# Patient Record
Sex: Male | Born: 1955 | Race: White | Hispanic: No | Marital: Married | Smoking: Never smoker
Health system: Southern US, Community
[De-identification: ages and names within clinical notes are randomized; demographics above are authoritative.]

## PROBLEM LIST (undated history)

## (undated) DIAGNOSIS — G4700 Insomnia, unspecified: Secondary | ICD-10-CM

## (undated) DIAGNOSIS — R51 Headache: Secondary | ICD-10-CM

## (undated) DIAGNOSIS — R519 Headache, unspecified: Secondary | ICD-10-CM

## (undated) DIAGNOSIS — G4730 Sleep apnea, unspecified: Secondary | ICD-10-CM

## (undated) DIAGNOSIS — E291 Testicular hypofunction: Secondary | ICD-10-CM

## (undated) HISTORY — DX: Headache: R51

## (undated) HISTORY — DX: Headache, unspecified: R51.9

## (undated) HISTORY — DX: Testicular hypofunction: E29.1

## (undated) HISTORY — DX: Insomnia, unspecified: G47.00

---

## 1991-06-11 HISTORY — PX: NASAL SINUS SURGERY: SHX719

## 1997-06-10 HISTORY — PX: ELBOW SURGERY: SHX618

## 2015-05-16 ENCOUNTER — Ambulatory Visit (INDEPENDENT_AMBULATORY_CARE_PROVIDER_SITE_OTHER): Payer: 59 | Admitting: Endocrinology

## 2015-05-16 ENCOUNTER — Encounter: Payer: Self-pay | Admitting: Endocrinology

## 2015-05-16 VITALS — BP 146/88 | HR 88 | Temp 98.7°F | Ht 71.5 in | Wt 254.0 lb

## 2015-05-16 DIAGNOSIS — E291 Testicular hypofunction: Secondary | ICD-10-CM | POA: Diagnosis not present

## 2015-05-16 LAB — T4, FREE: Free T4: 0.77 ng/dL (ref 0.60–1.60)

## 2015-05-16 LAB — TSH: TSH: 1.42 u[IU]/mL (ref 0.35–4.50)

## 2015-05-16 NOTE — Patient Instructions (Addendum)
blood tests are requested for you today.  We'll let you know about the results. If you wish to have other blood tests to see why the testosterone is low, we would need to do those after being off the testosterone for a few months.  Please let me know.   I would be happy to see you back here as necessary.

## 2015-05-16 NOTE — Progress Notes (Signed)
Subjective:    Patient ID: Kristopher Stout, male    DOB: [birth year], 59 y.o.   MRN: [id number]  HPI Pt reports he had puberty at age 78.  He has 2 biological children.  He says he has never taken illicit androgens.  He does not take antiandrogens or opioids.  He denies any h/o infertility, XRT, or genital infection.  He has never had surgery, or a serious injury to the head or genital area.  He consumes alcohol only 1/week. He was dx'ed with low testosterone in 2015. He presented with moderate loss of hair on the legs, and assoc fatigue.  He was rx'ed androgel.  He is unaware of any w/u of hypogonadism.  He takes androgel qd as rx'ed, but sxs have not improved.  Therefore, he plans to d/c the androgel next week, when he runs out.  No past medical history on file.  No past surgical history on file.  Social History   Social History  . Marital Status: Married    Spouse Name: N/A  . Number of Children: N/A  . Years of Education: N/A   Occupational History  . Not on file.   Social History Main Topics  . Smoking status: Unknown If Ever Smoked  . Smokeless tobacco: Not on file  . Alcohol Use: Not on file  . Drug Use: Not on file  . Sexual Activity: Not on file   Other Topics Concern  . Not on file   Social History Narrative  . No narrative on file    No current outpatient prescriptions on file prior to visit.   No current facility-administered medications on file prior to visit.    No Known Allergies  Family History  Problem Relation Age of Onset  . Other Neg Hx     hypogonadism    BP 146/88 mmHg  Pulse 88  Temp(Src) 98.7 F (37.1 C) (Oral)  Ht 5' 11.5" (1.816 m)  Wt 254 lb (115.214 kg)  BMI 34.94 kg/m2  SpO2 98%  Review of Systems denies depression, numbness, erectile dysfunction, decreased urinary stream, gynecomastia, muscle weakness, fever, headache, easy bruising, sob, rash, blurry vision, rhinorrhea, and chest pain.  He has weight gain and decreased libido.       Objective:   Physical Exam VS: see vs page GEN: no distress HEAD: head: no deformity eyes: no periorbital swelling, no proptosis external nose and ears are normal mouth: no lesion seen NECK: supple, thyroid is not enlarged CHEST WALL: no deformity LUNGS: clear to auscultation BREASTS:  No gynecomastia CV: reg rate and rhythm, no murmur ABD: abdomen is soft, nontender.  no hepatosplenomegaly.  not distended.  no hernia GENITALIA:  Normal male, except testicles are small and soft.   MUSCULOSKELETAL: muscle bulk and strength are grossly normal.  no obvious joint swelling.  gait is normal and steady EXTEMITIES: no deformity.  no ulcer on the feet.  feet are of normal color and temp.  no edema.   PULSES: dorsalis pedis intact bilat.  no carotid bruit NEURO:  cn 2-12 grossly intact.   readily moves all 4's.  sensation is intact to touch on the feet SKIN:  Normal texture and temperature.  No rash or suspicious lesion is visible.  Diffusely decreased hair distribution NODES:  None palpable at the neck PSYCH: alert, well-oriented.  Does not appear anxious nor depressed.  I have reviewed outside records, and summarized: Pt was noted to have low testosterone, and referred here.  outside test results  are reviewed: Total testosterone=251      Assessment & Plan:  Hypogonadism, new to me.  uncertain etiology.    Patient is advised the following: Patient Instructions  blood tests are requested for you today.  We'll let you know about the results. If you wish to have other blood tests to see why the testosterone is low, we would need to do those after being off the testosterone for a few months.  Please let me know.   I would be happy to see you back here as necessary.

## 2015-05-17 LAB — PROLACTIN: PROLACTIN: 6.9 ng/mL (ref 2.1–17.1)

## 2015-05-19 LAB — TESTOSTERONE,FREE AND TOTAL
TESTOSTERONE FREE: 6.3 pg/mL — AB (ref 7.2–24.0)
TESTOSTERONE: 236 ng/dL — AB (ref 348–1197)

## 2015-10-04 ENCOUNTER — Other Ambulatory Visit (INDEPENDENT_AMBULATORY_CARE_PROVIDER_SITE_OTHER): Payer: 59

## 2015-10-04 ENCOUNTER — Telehealth: Payer: Self-pay

## 2015-10-04 DIAGNOSIS — E291 Testicular hypofunction: Secondary | ICD-10-CM

## 2015-10-04 LAB — LUTEINIZING HORMONE: LH: 7.33 m[IU]/mL (ref 1.50–9.30)

## 2015-10-04 NOTE — Telephone Encounter (Signed)
Ok, i ordered 

## 2015-10-04 NOTE — Telephone Encounter (Signed)
Ok, but before ordering, i need to know when you last took the androgel.

## 2015-10-04 NOTE — Telephone Encounter (Signed)
Pt is scheduled to come in for labs today at 330 pm. Could you review and please advise what orders should be placed? Thanks!

## 2015-10-04 NOTE — Telephone Encounter (Signed)
I contacted the pt and he stated the last time he took the androgel was November.

## 2015-10-06 LAB — TESTOSTERONE,FREE AND TOTAL
TESTOSTERONE: 150 ng/dL — AB (ref 348–1197)
Testosterone, Free: 6.1 pg/mL — ABNORMAL LOW (ref 7.2–24.0)

## 2015-10-07 ENCOUNTER — Other Ambulatory Visit: Payer: Self-pay | Admitting: Endocrinology

## 2015-10-07 DIAGNOSIS — E291 Testicular hypofunction: Secondary | ICD-10-CM

## 2015-10-07 MED ORDER — CLOMIPHENE CITRATE 50 MG PO TABS: ORAL_TABLET | ORAL | Status: DC

## 2015-12-07 ENCOUNTER — Other Ambulatory Visit: Payer: 59

## 2015-12-13 ENCOUNTER — Other Ambulatory Visit: Payer: 59

## 2015-12-13 DIAGNOSIS — E291 Testicular hypofunction: Secondary | ICD-10-CM

## 2015-12-14 ENCOUNTER — Other Ambulatory Visit: Payer: Self-pay | Admitting: Endocrinology

## 2015-12-14 LAB — TESTOSTERONE,FREE AND TOTAL
Testosterone, Free: 24.2 pg/mL — ABNORMAL HIGH (ref 7.2–24.0)
Testosterone: 706 ng/dL (ref 348–1197)

## 2015-12-14 MED ORDER — CLOMIPHENE CITRATE 50 MG PO TABS: ORAL_TABLET | ORAL | Status: DC

## 2016-04-20 ENCOUNTER — Other Ambulatory Visit: Payer: Self-pay | Admitting: Endocrinology

## 2016-04-20 NOTE — Telephone Encounter (Signed)
Please refill x 1 Ov is due  

## 2016-09-09 ENCOUNTER — Telehealth: Payer: Self-pay | Admitting: Endocrinology

## 2016-09-09 MED ORDER — CLOMIPHENE CITRATE 50 MG PO TABS: ORAL_TABLET | ORAL | 0 refills | Status: DC

## 2016-09-09 NOTE — Telephone Encounter (Signed)
Dr. Everardo All when you can please advise on this message. I was not sure how to proceed. Last office visit was on 05/16/2015. Thanks!

## 2016-09-09 NOTE — Telephone Encounter (Signed)
Pt called in and said that he was about to run out of his Clomid, but he did not know if it was time for his monthly labs yet or not, please advise.

## 2016-09-09 NOTE — Telephone Encounter (Signed)
Rx submitted and patient scheduled for 09/27/2016 at 145 pm.

## 2016-09-09 NOTE — Telephone Encounter (Signed)
Please refill x 1 Ov is due  

## 2016-09-27 ENCOUNTER — Ambulatory Visit (INDEPENDENT_AMBULATORY_CARE_PROVIDER_SITE_OTHER): Payer: 59 | Admitting: Endocrinology

## 2016-09-27 VITALS — BP 144/78 | HR 99 | Temp 98.8°F | Ht 72.0 in | Wt 254.4 lb

## 2016-09-27 DIAGNOSIS — G4700 Insomnia, unspecified: Secondary | ICD-10-CM | POA: Diagnosis not present

## 2016-09-27 DIAGNOSIS — Z125 Encounter for screening for malignant neoplasm of prostate: Secondary | ICD-10-CM | POA: Diagnosis not present

## 2016-09-27 DIAGNOSIS — R519 Headache, unspecified: Secondary | ICD-10-CM

## 2016-09-27 DIAGNOSIS — E291 Testicular hypofunction: Secondary | ICD-10-CM

## 2016-09-27 DIAGNOSIS — R51 Headache: Secondary | ICD-10-CM | POA: Diagnosis not present

## 2016-09-27 LAB — CBC WITH DIFFERENTIAL/PLATELET
Basophils Absolute: 0 10*3/uL (ref 0.0–0.1)
Basophils Relative: 0.7 % (ref 0.0–3.0)
EOS PCT: 4.5 % (ref 0.0–5.0)
Eosinophils Absolute: 0.3 10*3/uL (ref 0.0–0.7)
HCT: 45 % (ref 39.0–52.0)
HEMOGLOBIN: 15.1 g/dL (ref 13.0–17.0)
LYMPHS ABS: 1.8 10*3/uL (ref 0.7–4.0)
Lymphocytes Relative: 30.4 % (ref 12.0–46.0)
MCHC: 33.6 g/dL (ref 30.0–36.0)
MCV: 96 fl (ref 78.0–100.0)
MONO ABS: 0.6 10*3/uL (ref 0.1–1.0)
Monocytes Relative: 9.8 % (ref 3.0–12.0)
NEUTROS PCT: 54.6 % (ref 43.0–77.0)
Neutro Abs: 3.2 10*3/uL (ref 1.4–7.7)
PLATELETS: 218 10*3/uL (ref 150.0–400.0)
RBC: 4.69 Mil/uL (ref 4.22–5.81)
RDW: 13.3 % (ref 11.5–15.5)
WBC: 5.9 10*3/uL (ref 4.0–10.5)

## 2016-09-27 LAB — TSH: TSH: 0.81 u[IU]/mL (ref 0.35–4.50)

## 2016-09-27 LAB — PSA: PSA: 2.37 ng/mL (ref 0.10–4.00)

## 2016-09-27 NOTE — Patient Instructions (Signed)
blood tests are requested for you today.  We'll let you know about the results. Testosterone treatment has risks, including increased or decreased fertility (depending on the type of treatment), hair loss, prostate cancer, benign prostate enlargement, blood clots, liver problems, lower hdl ("good cholesterol"), polycythemia (opposite of anemia), sleep apnea, and behavior changes.   Weight loss helps the testosterone, also.   Please return in 1 year.   

## 2016-09-27 NOTE — Progress Notes (Signed)
   Subjective:    Patient ID: Kristopher Stout, male    DOB: [birth year], 61 y.o.   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  He has fatigue.  ED has recurred.  Past Medical History:  Diagnosis Date  . Hypogonadism male     No past surgical history on file.  Social History   Social History  . Marital status: Married    Spouse name: N/A  . Number of children: N/A  . Years of education: N/A   Occupational History  . Not on file.   Social History Main Topics  . Smoking status: Unknown If Ever Smoked  . Smokeless tobacco: Not on file  . Alcohol use Not on file  . Drug use: Unknown  . Sexual activity: Not on file   Other Topics Concern  . Not on file   Social History Narrative  . No narrative on file    Current Outpatient Prescriptions on File Prior to Visit  Medication Sig Dispense Refill  . clomiPHENE (CLOMID) 50 MG tablet TAKE 1/4 TAB BY MOUTH, 3 TIMES PER WEEK 10 tablet 0  . diclofenac (VOLTAREN) 50 MG EC tablet Take 50 mg by mouth 2 (two) times daily.    . fluticasone (FLONASE) 50 MCG/ACT nasal spray Place into both nostrils daily.    . meclizine (ANTIVERT) 12.5 MG tablet Take 12.5 mg by mouth 3 (three) times daily as needed for dizziness.    . SUMAtriptan (IMITREX) 20 MG/ACT nasal spray Place 20 mg into the nose every 2 (two) hours as needed for migraine or headache. May repeat in 2 hours if headache persists or recurs.    Marland Kitchen zolpidem (AMBIEN CR) 12.5 MG CR tablet Take 12.5 mg by mouth at bedtime as needed for sleep.     No current facility-administered medications on file prior to visit.     No Known Allergies  Family History  Problem Relation Age of Onset  . Other Neg Hx     hypogonadism    BP (!) 144/78 (BP Location: Left Arm, Patient Position: Sitting, Cuff Size: Large)    Pulse 99   Temp 98.8 F (37.1 C) (Oral)   Ht 6' (1.829 m)   Wt 254 lb 6 oz (115.4 kg)   SpO2 96%   BMI 34.50 kg/m   Review of Systems Denies decreased urinary stream    Objective:   Physical Exam VITAL SIGNS:  See vs page.  GENERAL: no distress.  Ext: no edema.       Assessment & Plan:  Hypogonadism: due for recheck.   Obesity: this exacerbates hypogonadism.    Patient Instructions  blood tests are requested for you today.  We'll let you know about the results. Testosterone treatment has risks, including increased or decreased fertility (depending on the type of treatment), hair loss, prostate cancer, benign prostate enlargement, blood clots, liver problems, lower hdl ("good cholesterol"), polycythemia (opposite of anemia), sleep apnea, and behavior changes.  Weight loss helps the testosterone, also.   Please return in 1 year.

## 2016-09-29 ENCOUNTER — Encounter: Payer: Self-pay | Admitting: Endocrinology

## 2016-09-29 DIAGNOSIS — R51 Headache: Secondary | ICD-10-CM

## 2016-09-29 DIAGNOSIS — E291 Testicular hypofunction: Secondary | ICD-10-CM | POA: Insufficient documentation

## 2016-09-29 DIAGNOSIS — R519 Headache, unspecified: Secondary | ICD-10-CM | POA: Insufficient documentation

## 2016-09-29 DIAGNOSIS — G4700 Insomnia, unspecified: Secondary | ICD-10-CM | POA: Insufficient documentation

## 2016-09-29 LAB — TESTOSTERONE,FREE AND TOTAL
TESTOSTERONE: 399 ng/dL (ref 264–916)
Testosterone, Free: 8.9 pg/mL (ref 7.2–24.0)

## 2016-09-30 ENCOUNTER — Other Ambulatory Visit: Payer: Self-pay

## 2016-09-30 MED ORDER — CLOMIPHENE CITRATE 50 MG PO TABS: ORAL_TABLET | ORAL | 4 refills | Status: DC

## 2017-07-21 HISTORY — PX: CHOLECYSTECTOMY: SHX55

## 2017-09-24 ENCOUNTER — Ambulatory Visit (INDEPENDENT_AMBULATORY_CARE_PROVIDER_SITE_OTHER): Payer: 59 | Admitting: Endocrinology

## 2017-09-24 ENCOUNTER — Encounter: Payer: Self-pay | Admitting: Endocrinology

## 2017-09-24 VITALS — BP 158/82 | HR 81 | Wt 257.2 lb

## 2017-09-24 DIAGNOSIS — E291 Testicular hypofunction: Secondary | ICD-10-CM

## 2017-09-24 DIAGNOSIS — Z125 Encounter for screening for malignant neoplasm of prostate: Secondary | ICD-10-CM | POA: Diagnosis not present

## 2017-09-24 NOTE — Progress Notes (Signed)
Subjective:    Patient ID: Kristopher Stout, male    DOB: [birth year], 62 y.o.   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  Main symptom is again fatigue.  He has some ED sxs.   Past Medical History:  Diagnosis Date  . Headache   . Hypogonadism male   . Insomnia     History reviewed. No pertinent surgical history.  Social History   Socioeconomic History  . Marital status: Married    Spouse name: Not on file  . Number of children: Not on file  . Years of education: Not on file  . Highest education level: Not on file  Occupational History  . Not on file  Social Needs  . Financial resource strain: Not on file  . Food insecurity:    Worry: Not on file    Inability: Not on file  . Transportation needs:    Medical: Not on file    Non-medical: Not on file  Tobacco Use  . Smoking status: Unknown If Ever Smoked  Substance and Sexual Activity  . Alcohol use: Not on file  . Drug use: Not on file  . Sexual activity: Not on file  Lifestyle  . Physical activity:    Days per week: Not on file    Minutes per session: Not on file  . Stress: Not on file  Relationships  . Social connections:    Talks on phone: Not on file    Gets together: Not on file    Attends religious service: Not on file    Active member of club or organization: Not on file    Attends meetings of clubs or organizations: Not on file    Relationship status: Not on file  . Intimate partner violence:    Fear of current or ex partner: Not on file    Emotionally abused: Not on file    Physically abused: Not on file    Forced sexual activity: Not on file  Other Topics Concern  . Not on file  Social History Narrative  . Not on file    Current Outpatient Medications on File Prior to Visit  Medication Sig Dispense Refill    . clomiPHENE (CLOMID) 50 MG tablet TAKE 1/4 TAB BY MOUTH, 3 TIMES PER WEEK 15 tablet 4  . diclofenac (VOLTAREN) 50 MG EC tablet Take 50 mg by mouth 2 (two) times daily.    . fluticasone (FLONASE) 50 MCG/ACT nasal spray Place into both nostrils daily.    . meclizine (ANTIVERT) 12.5 MG tablet Take 12.5 mg by mouth 3 (three) times daily as needed for dizziness.    . SUMAtriptan (IMITREX) 20 MG/ACT nasal spray Place 20 mg into the nose every 2 (two) hours as needed for migraine or headache. May repeat in 2 hours if headache persists or recurs.    Marland Kitchen zolpidem (AMBIEN CR) 12.5 MG CR tablet Take 12.5 mg by mouth at bedtime as needed for sleep.     No current facility-administered medications on file prior to visit.     No Known Allergies  Family History  Problem Relation Age of Onset  . Other Neg Hx        hypogonadism    BP (!) 158/82 (BP Location: Left Arm, Patient Position: Sitting, Cuff Size: Normal)   Pulse  81   Wt 257 lb 3.2 oz (116.7 kg)   SpO2 95%   BMI 34.88 kg/m    Review of Systems Denies insomnia.      Objective:   Physical Exam VITAL SIGNS:  See vs page.  GENERAL: no distress.  Ext: no edema.    Lab Results  Component Value Date   TESTOSTERONE 365 09/24/2017      Assessment & Plan:  HTN: is noted today Hypogonadism: well-controlled Fatigue: check TSH  Patient Instructions  Your blood pressure is high today.  Please see your primary care provider soon, to have it rechecked blood tests are requested for you today.  We'll let you know about the results. Testosterone treatment has risks, including increased or decreased fertility (depending on the type of treatment), hair loss, prostate cancer, benign prostate enlargement, blood clots, liver problems, lower hdl ("good cholesterol"), polycythemia (opposite of anemia), sleep apnea, and behavior changes. Please come back for a follow-up appointment in 1 year.

## 2017-09-24 NOTE — Patient Instructions (Signed)
Your blood pressure is high today.  Please see your primary care provider soon, to have it rechecked blood tests are requested for you today.  We'll let you know about the results. Testosterone treatment has risks, including increased or decreased fertility (depending on the type of treatment), hair loss, prostate cancer, benign prostate enlargement, blood clots, liver problems, lower hdl ("good cholesterol"), polycythemia (opposite of anemia), sleep apnea, and behavior changes. Please come back for a follow-up appointment in 1 year.

## 2017-09-25 LAB — CBC WITH DIFFERENTIAL/PLATELET
BASOS PCT: 0.9 % (ref 0.0–3.0)
Basophils Absolute: 0.1 10*3/uL (ref 0.0–0.1)
EOS PCT: 6.8 % — AB (ref 0.0–5.0)
Eosinophils Absolute: 0.4 10*3/uL (ref 0.0–0.7)
HEMATOCRIT: 41.1 % (ref 39.0–52.0)
HEMOGLOBIN: 14.5 g/dL (ref 13.0–17.0)
Lymphocytes Relative: 38 % (ref 12.0–46.0)
Lymphs Abs: 2.2 10*3/uL (ref 0.7–4.0)
MCHC: 35.2 g/dL (ref 30.0–36.0)
MCV: 94.6 fl (ref 78.0–100.0)
Monocytes Absolute: 0.7 10*3/uL (ref 0.1–1.0)
Monocytes Relative: 12.8 % — ABNORMAL HIGH (ref 3.0–12.0)
Neutro Abs: 2.4 10*3/uL (ref 1.4–7.7)
Neutrophils Relative %: 41.5 % — ABNORMAL LOW (ref 43.0–77.0)
Platelets: 205 10*3/uL (ref 150.0–400.0)
RBC: 4.35 Mil/uL (ref 4.22–5.81)
RDW: 12.8 % (ref 11.5–15.5)
WBC: 5.8 10*3/uL (ref 4.0–10.5)

## 2017-09-25 LAB — PSA: PSA: 2.22 ng/mL (ref 0.10–4.00)

## 2017-09-25 LAB — TESTOSTERONE,FREE AND TOTAL
Testosterone, Free: 7 pg/mL (ref 6.6–18.1)
Testosterone: 365 ng/dL (ref 264–916)

## 2017-09-25 LAB — TSH: TSH: 1.27 u[IU]/mL (ref 0.35–4.50)

## 2017-09-26 ENCOUNTER — Ambulatory Visit: Payer: 59 | Admitting: Endocrinology

## 2017-10-21 ENCOUNTER — Other Ambulatory Visit: Payer: Self-pay | Admitting: Endocrinology

## 2018-03-23 ENCOUNTER — Ambulatory Visit (INDEPENDENT_AMBULATORY_CARE_PROVIDER_SITE_OTHER): Payer: 59 | Admitting: Orthopedic Surgery

## 2018-03-23 ENCOUNTER — Encounter (INDEPENDENT_AMBULATORY_CARE_PROVIDER_SITE_OTHER): Payer: Self-pay | Admitting: Orthopedic Surgery

## 2018-03-23 DIAGNOSIS — S46011A Strain of muscle(s) and tendon(s) of the rotator cuff of right shoulder, initial encounter: Secondary | ICD-10-CM | POA: Diagnosis not present

## 2018-03-25 ENCOUNTER — Encounter (INDEPENDENT_AMBULATORY_CARE_PROVIDER_SITE_OTHER): Payer: Self-pay | Admitting: Orthopedic Surgery

## 2018-03-25 NOTE — Progress Notes (Signed)
Office Visit Note   Patient: Kristopher Stout           Date of Birth: 26-Mar-1956           MRN: [id number] Visit Date: 03/23/2018 Requested by: Wilburn Mylar, MD [address] [place], [place] [address] PCP: Wilburn Mylar, MD  Subjective: Chief Complaint  Patient presents with  . 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  Had acute onset of pain 11/05/2017 when he woke up with the pain.  Was not necessarily a trauma but it was rather abrupt onset.  He reports consistent pain in that right shoulder worse at night when trying to lay down.  He also reports weakness particularly with forward flexion.  Also describes catching.  Had a rotator cuff tear 10 years ago and had therapy and no problems since.  He would like to have surgery in the near future so he can be recovered by the holidays.  It is hard for him to get his arm up above shoulder level.  He likes to work on old cars which can be physical.              ROS: All systems reviewed are negative as they relate to the chief complaint within the history of present illness.  Patient denies  fevers or chills.   Assessment & Plan: Visit Diagnoses:  1. Traumatic complete tear of right rotator cuff, initial encounter     Plan: Impression is right shoulder rotator cuff tear based on MRI scan which is reviewed today.  Looks like its leading edge supraspinatus tear with little retraction.  There is also a degenerative posterior labral tear which looks again degenerative in nature not traumatic.  The biceps tendon itself looks intact.  There is some mild AC joint arthropathy but he is not tender in this location.  I think [name] would do well to have his cuff repaired because of some weakness with activities of daily living.  Risk and benefits of surgical intervention discussed.  They include but not limited to infection nerve vessel damage incomplete healing as well as loss of motion.  I would like  to use a CPM machine with him post surgery.  All questions answered.  He will concern to his surgical options and let me know if he wants to proceed.  After we discussed all this he did tell me that he has an appointment with surgery scheduled for next week which I encouraged him to keep.  I think he is here for a second opinion at this time.  I think he would do fine to have the surgery done with his original surgeon.  Follow-Up Instructions: No follow-ups on file.   Orders:  No orders of the defined types were placed in this encounter.  No orders of the defined types were placed in this encounter.     Procedures: No procedures performed   Clinical Data: No additional findings.  Objective: Vital Signs: There were no vitals taken for this visit.  Physical Exam:   Constitutional: Patient appears well-developed HEENT:  Head: Normocephalic Eyes:EOM are normal Neck: Normal range of motion Cardiovascular: Normal rate Pulmonary/chest: Effort normal Neurologic: Patient is alert Skin: Skin is warm Psychiatric: Patient has normal mood and affect    Ortho Exam: Ortho exam demonstrates good cervical spine range of motion.  He does have a little weakness to supraspinatus testing on the right compared  to the left.  Not much in the way of coarseness with passive range of motion both below shoulder level as well as at 90 degrees.  O'Brien's testing is negative and there is no discrete AC joint tenderness right versus left.  Specialty Comments:  No specialty comments available.  Imaging: No results found.   PMFS History: Patient Active Problem List   Diagnosis Date Noted  . Hypogonadism male   . Headache   . Insomnia   . Screening for prostate cancer 09/27/2016  . Hypogonadism, male 05/16/2015   Past Medical History:  Diagnosis Date  . Headache   . Hypogonadism male   . Insomnia     Family History  Problem Relation Age of Onset  . Other Neg Hx        hypogonadism      History reviewed. No pertinent surgical history. Social History   Occupational History  . Not on file  Tobacco Use  . Smoking status: Unknown If Ever Smoked  Substance and Sexual Activity  . Alcohol use: Not on file  . Drug use: Not on file  . Sexual activity: Not on file

## 2018-04-03 ENCOUNTER — Other Ambulatory Visit (INDEPENDENT_AMBULATORY_CARE_PROVIDER_SITE_OTHER): Payer: Self-pay | Admitting: Orthopedic Surgery

## 2018-04-03 ENCOUNTER — Other Ambulatory Visit: Payer: Self-pay

## 2018-04-03 ENCOUNTER — Encounter (HOSPITAL_COMMUNITY)
Admission: RE | Admit: 2018-04-03 | Discharge: 2018-04-03 | Disposition: A | Discharge status: Home / Self Care | Payer: 59 | Source: Ambulatory Visit | Attending: Orthopedic Surgery | Admitting: Orthopedic Surgery

## 2018-04-03 ENCOUNTER — Encounter (HOSPITAL_COMMUNITY): Payer: Self-pay

## 2018-04-03 DIAGNOSIS — Z01812 Encounter for preprocedural laboratory examination: Secondary | ICD-10-CM | POA: Diagnosis not present

## 2018-04-03 DIAGNOSIS — S46011A Strain of muscle(s) and tendon(s) of the rotator cuff of right shoulder, initial encounter: Secondary | ICD-10-CM

## 2018-04-03 HISTORY — DX: Sleep apnea, unspecified: G47.30

## 2018-04-03 LAB — CBC
HCT: 43.1 % (ref 39.0–52.0)
Hemoglobin: 14.9 g/dL (ref 13.0–17.0)
MCH: 32.9 pg (ref 26.0–34.0)
MCHC: 34.6 g/dL (ref 30.0–36.0)
MCV: 95.1 fL (ref 80.0–100.0)
PLATELETS: 320 10*3/uL (ref 150–400)
RBC: 4.53 MIL/uL (ref 4.22–5.81)
RDW: 11.9 % (ref 11.5–15.5)
WBC: 5.3 10*3/uL (ref 4.0–10.5)
nRBC: 0.6 % — ABNORMAL HIGH (ref 0.0–0.2)

## 2018-04-03 NOTE — Progress Notes (Signed)
PCP - Dr. Jenita Seashore Cardiologist - denies   Chest x-ray - N/A EKG - 07/11/17- CE Stress Test - denies ECHO - denies Cardiac Cath - denies  Sleep Study - 11/2008 CPAP - Patient states he only uses CPAP if he "wakes up in the middle of the night". Does not uses on daily basis.  Aspirin Instructions: Patient instructed to stop all ASA/NSAIDs, herbal medications and vitamins 7 days prior to surgery.  Anesthesia review: no  Patient denies shortness of breath, fever, cough and chest pain at PAT appointment   Patient verbalized understanding of instructions that were given to them at the PAT appointment. Patient was also instructed that they will need to review over the PAT instructions again at home before surgery.

## 2018-04-03 NOTE — Pre-Procedure Instructions (Signed)
Bodee Lafoe [facility]  04/03/2018      [id number] - [place], [place] - [address] [place] [address] Phone: [phone] Fax: [phone]    Your procedure is scheduled on Thursday November 7.  Report to [facility] Admitting at 10:15 A.M.  Call this number if you have problems the morning of surgery:  [phone]   Remember:  Do not eat or drink after midnight.     Take these medicines the morning of surgery with A SIP OF WATER IF NEEDED cyclobenzaprine (FLEXERIL)  fluticasone (FLONASE) meclizine (ANTIVERT)  promethazine (PHENERGAN)  SUMAtriptan (IMITREX)   7 days prior to surgery STOP taking any Aspirin, Voltaren (diclofenac) Aleve, Naproxen, Ibuprofen, Motrin, Advil, Goody's, BC's, all herbal medications, fish oil, and all vitamins    Do not wear jewelry  Do not wear lotions, powders, or colognes, or deodorant.  Do not shave 48 hours prior to surgery.  Men may shave face and neck.  Do not bring valuables to the hospital.  [facility] is not responsible for any belongings or valuables.  Contacts, eyeglasses, hearing aids, dentures or bridgework may not be worn into surgery.  Leave your suitcase in the car.  After surgery it may be brought to your room.  For patients admitted to the hospital, discharge time will be determined by your treatment team.  Patients discharged the day of surgery will not be allowed to drive home.   [place]- Preparing For Surgery  Before surgery, you can play an important role. Because skin is not sterile, your skin needs to be as free of germs as possible. You can reduce the number of germs on your skin by washing with CHG (chlorahexidine gluconate) Soap before surgery.  CHG is an antiseptic cleaner which kills germs and bonds with the skin to continue killing germs even after washing.    Oral Hygiene is also important to reduce your risk of infection.  Remember - BRUSH YOUR  TEETH THE MORNING OF SURGERY WITH YOUR REGULAR TOOTHPASTE  Please do not use if you have an allergy to CHG or antibacterial soaps. If your skin becomes reddened/irritated stop using the CHG.  Do not shave (including legs and underarms) for at least 48 hours prior to first CHG shower. It is OK to shave your face.  Please follow these instructions carefully.   1. Shower the NIGHT BEFORE SURGERY and the MORNING OF SURGERY with CHG.   2. If you chose to wash your hair, wash your hair first as usual with your normal shampoo.  3. After you shampoo, rinse your hair and body thoroughly to remove the shampoo.  4. Use CHG as you would any other liquid soap. You can apply CHG directly to the skin and wash gently with a scrungie or a clean washcloth.   5. Apply the CHG Soap to your body ONLY FROM THE NECK DOWN.  Do not use on open wounds or open sores. Avoid contact with your eyes, ears, mouth and genitals (private parts). Wash Face and genitals (private parts)  with your normal soap.  6. Wash thoroughly, paying special attention to the area where your surgery will be performed.  7. Thoroughly rinse your body with warm water from the neck down.  8. DO NOT shower/wash with your normal soap after using and rinsing off the CHG Soap.  9. Pat yourself dry with a CLEAN TOWEL.  10. Wear CLEAN PAJAMAS to  bed the night before surgery, wear comfortable clothes the morning of surgery  11. Place CLEAN SHEETS on your bed the night of your first shower and DO NOT SLEEP WITH PETS.    Day of Surgery: Shower as stated above. Do not apply any deodorants/lotions.  Please wear clean clothes to the hospital/surgery center.   Remember to brush your teeth WITH YOUR REGULAR TOOTHPASTE.    Please read over the following fact sheets that you were given.

## 2018-04-15 MED ORDER — DEXTROSE 5 % IV SOLN
3.0000 g | INTRAVENOUS | Status: AC
  Administered 2018-04-16: 2 g via INTRAVENOUS
  Filled 2018-04-15: qty 3

## 2018-04-16 ENCOUNTER — Encounter (HOSPITAL_COMMUNITY): Payer: Self-pay

## 2018-04-16 ENCOUNTER — Ambulatory Visit (HOSPITAL_COMMUNITY): Payer: 59 | Admitting: Anesthesiology

## 2018-04-16 ENCOUNTER — Other Ambulatory Visit: Payer: Self-pay

## 2018-04-16 ENCOUNTER — Ambulatory Visit (HOSPITAL_COMMUNITY)
Admission: RE | Admit: 2018-04-16 | Discharge: 2018-04-16 | Disposition: A | Discharge status: Home / Self Care | Payer: 59 | Source: Ambulatory Visit | Attending: Orthopedic Surgery | Admitting: Orthopedic Surgery

## 2018-04-16 ENCOUNTER — Encounter: Payer: Self-pay | Admitting: Orthopedic Surgery

## 2018-04-16 ENCOUNTER — Encounter (HOSPITAL_COMMUNITY)
Admission: RE | Disposition: A | Discharge status: Home / Self Care | Payer: Self-pay | Source: Ambulatory Visit | Attending: Orthopedic Surgery

## 2018-04-16 DIAGNOSIS — M65811 Other synovitis and tenosynovitis, right shoulder: Secondary | ICD-10-CM

## 2018-04-16 DIAGNOSIS — M19011 Primary osteoarthritis, right shoulder: Secondary | ICD-10-CM | POA: Diagnosis not present

## 2018-04-16 DIAGNOSIS — M75101 Unspecified rotator cuff tear or rupture of right shoulder, not specified as traumatic: Secondary | ICD-10-CM | POA: Diagnosis present

## 2018-04-16 DIAGNOSIS — E669 Obesity, unspecified: Secondary | ICD-10-CM | POA: Insufficient documentation

## 2018-04-16 DIAGNOSIS — Z6835 Body mass index (BMI) 35.0-35.9, adult: Secondary | ICD-10-CM | POA: Insufficient documentation

## 2018-04-16 DIAGNOSIS — X58XXXA Exposure to other specified factors, initial encounter: Secondary | ICD-10-CM | POA: Insufficient documentation

## 2018-04-16 DIAGNOSIS — S46011A Strain of muscle(s) and tendon(s) of the rotator cuff of right shoulder, initial encounter: Secondary | ICD-10-CM

## 2018-04-16 DIAGNOSIS — G4730 Sleep apnea, unspecified: Secondary | ICD-10-CM | POA: Diagnosis not present

## 2018-04-16 DIAGNOSIS — S43431A Superior glenoid labrum lesion of right shoulder, initial encounter: Secondary | ICD-10-CM | POA: Diagnosis not present

## 2018-04-16 HISTORY — PX: SHOULDER ARTHROSCOPY WITH ROTATOR CUFF REPAIR AND SUBACROMIAL DECOMPRESSION: SHX5686

## 2018-04-16 SURGERY — SHOULDER ARTHROSCOPY WITH ROTATOR CUFF REPAIR AND SUBACROMIAL DECOMPRESSION
Anesthesia: General | Laterality: Right

## 2018-04-16 MED ORDER — BUPIVACAINE HCL (PF) 0.5 % IJ SOLN
INTRAMUSCULAR | Status: DC | PRN
  Administered 2018-04-16: 15 mL via PERINEURAL

## 2018-04-16 MED ORDER — LIDOCAINE 2% (20 MG/ML) 5 ML SYRINGE
INTRAMUSCULAR | Status: AC
  Filled 2018-04-16: qty 5

## 2018-04-16 MED ORDER — PHENYLEPHRINE 40 MCG/ML (10ML) SYRINGE FOR IV PUSH (FOR BLOOD PRESSURE SUPPORT)
PREFILLED_SYRINGE | INTRAVENOUS | Status: AC
  Filled 2018-04-16: qty 10

## 2018-04-16 MED ORDER — EPINEPHRINE PF 1 MG/ML IJ SOLN
INTRAMUSCULAR | Status: DC | PRN
  Administered 2018-04-16: 1 mg

## 2018-04-16 MED ORDER — FENTANYL CITRATE (PF) 250 MCG/5ML IJ SOLN
INTRAMUSCULAR | Status: AC
  Filled 2018-04-16: qty 5

## 2018-04-16 MED ORDER — MIDAZOLAM HCL 2 MG/2ML IJ SOLN
INTRAMUSCULAR | Status: AC
  Filled 2018-04-16: qty 2

## 2018-04-16 MED ORDER — MIDAZOLAM HCL 5 MG/5ML IJ SOLN
INTRAMUSCULAR | Status: DC | PRN
  Administered 2018-04-16: 2 mg via INTRAVENOUS

## 2018-04-16 MED ORDER — ONDANSETRON HCL 4 MG/2ML IJ SOLN
INTRAMUSCULAR | Status: AC
  Filled 2018-04-16: qty 2

## 2018-04-16 MED ORDER — LIDOCAINE 2% (20 MG/ML) 5 ML SYRINGE
INTRAMUSCULAR | Status: DC | PRN
  Administered 2018-04-16: 100 mg via INTRAVENOUS

## 2018-04-16 MED ORDER — HALOPERIDOL LACTATE 5 MG/ML IJ SOLN
1.0000 mg | Freq: Once | INTRAMUSCULAR | Status: AC
  Administered 2018-04-16: 1 mg via INTRAVENOUS
  Filled 2018-04-16: qty 0.2

## 2018-04-16 MED ORDER — STERILE WATER FOR IRRIGATION IR SOLN
Status: DC | PRN
  Administered 2018-04-16: 1000 mL

## 2018-04-16 MED ORDER — EPHEDRINE 5 MG/ML INJ
INTRAVENOUS | Status: AC
  Filled 2018-04-16: qty 10

## 2018-04-16 MED ORDER — FENTANYL CITRATE (PF) 100 MCG/2ML IJ SOLN
INTRAMUSCULAR | Status: AC
  Administered 2018-04-16: 50 ug via INTRAVENOUS
  Filled 2018-04-16: qty 2

## 2018-04-16 MED ORDER — EPINEPHRINE PF 1 MG/ML IJ SOLN
INTRAMUSCULAR | Status: AC
  Filled 2018-04-16: qty 2

## 2018-04-16 MED ORDER — FENTANYL CITRATE (PF) 100 MCG/2ML IJ SOLN
50.0000 ug | Freq: Once | INTRAMUSCULAR | Status: AC
  Administered 2018-04-16: 50 ug via INTRAVENOUS

## 2018-04-16 MED ORDER — FENTANYL CITRATE (PF) 100 MCG/2ML IJ SOLN
INTRAMUSCULAR | Status: DC | PRN
  Administered 2018-04-16: 50 ug via INTRAVENOUS
  Administered 2018-04-16: 100 ug via INTRAVENOUS

## 2018-04-16 MED ORDER — MIDAZOLAM HCL 2 MG/2ML IJ SOLN
1.0000 mg | Freq: Once | INTRAMUSCULAR | Status: AC
  Administered 2018-04-16: 1 mg via INTRAVENOUS

## 2018-04-16 MED ORDER — CHLORHEXIDINE GLUCONATE 4 % EX LIQD: 60.0000 mL | Freq: Once | CUTANEOUS | Status: DC

## 2018-04-16 MED ORDER — PROMETHAZINE HCL 25 MG/ML IJ SOLN
6.2500 mg | INTRAMUSCULAR | Status: DC | PRN
  Administered 2018-04-16: 6.25 mg via INTRAVENOUS

## 2018-04-16 MED ORDER — EPHEDRINE SULFATE-NACL 50-0.9 MG/10ML-% IV SOSY
PREFILLED_SYRINGE | INTRAVENOUS | Status: DC | PRN
  Administered 2018-04-16: 10 mg via INTRAVENOUS

## 2018-04-16 MED ORDER — FENTANYL CITRATE (PF) 100 MCG/2ML IJ SOLN: 25.0000 ug | INTRAMUSCULAR | Status: DC | PRN

## 2018-04-16 MED ORDER — DEXAMETHASONE SODIUM PHOSPHATE 10 MG/ML IJ SOLN
INTRAMUSCULAR | Status: DC | PRN
  Administered 2018-04-16: 5 mg via INTRAVENOUS

## 2018-04-16 MED ORDER — SODIUM CHLORIDE 0.9 % IV SOLN
INTRAVENOUS | Status: DC | PRN
  Administered 2018-04-16: 14:00:00 via INTRAVENOUS
  Administered 2018-04-16: 25 ug/min via INTRAVENOUS

## 2018-04-16 MED ORDER — PHENYLEPHRINE 40 MCG/ML (10ML) SYRINGE FOR IV PUSH (FOR BLOOD PRESSURE SUPPORT)
PREFILLED_SYRINGE | INTRAVENOUS | Status: DC | PRN
  Administered 2018-04-16 (×2): 80 ug via INTRAVENOUS

## 2018-04-16 MED ORDER — ONDANSETRON HCL 4 MG/2ML IJ SOLN
INTRAMUSCULAR | Status: DC | PRN
  Administered 2018-04-16: 4 mg via INTRAVENOUS

## 2018-04-16 MED ORDER — BUPIVACAINE LIPOSOME 1.3 % IJ SUSP
INTRAMUSCULAR | Status: DC | PRN
  Administered 2018-04-16: 10 mL via PERINEURAL

## 2018-04-16 MED ORDER — ROCURONIUM BROMIDE 50 MG/5ML IV SOSY
PREFILLED_SYRINGE | INTRAVENOUS | Status: AC
  Filled 2018-04-16: qty 5

## 2018-04-16 MED ORDER — PROMETHAZINE HCL 25 MG/ML IJ SOLN
INTRAMUSCULAR | Status: AC
  Administered 2018-04-16: 6.25 mg via INTRAVENOUS
  Filled 2018-04-16: qty 1

## 2018-04-16 MED ORDER — ROCURONIUM BROMIDE 50 MG/5ML IV SOSY
PREFILLED_SYRINGE | INTRAVENOUS | Status: DC | PRN
  Administered 2018-04-16 (×2): 50 mg via INTRAVENOUS

## 2018-04-16 MED ORDER — MIDAZOLAM HCL 2 MG/2ML IJ SOLN
INTRAMUSCULAR | Status: AC
  Administered 2018-04-16: 1 mg via INTRAVENOUS
  Filled 2018-04-16: qty 2

## 2018-04-16 MED ORDER — LACTATED RINGERS IV SOLN
INTRAVENOUS | Status: DC
  Administered 2018-04-16 (×2): via INTRAVENOUS

## 2018-04-16 MED ORDER — OXYCODONE HCL 5 MG/5ML PO SOLN: 5.0000 mg | Freq: Once | ORAL | Status: DC | PRN

## 2018-04-16 MED ORDER — OXYCODONE HCL 5 MG PO TABS: 5.0000 mg | ORAL_TABLET | Freq: Once | ORAL | Status: DC | PRN

## 2018-04-16 MED ORDER — PROPOFOL 10 MG/ML IV BOLUS
INTRAVENOUS | Status: DC | PRN
  Administered 2018-04-16: 160 mg via INTRAVENOUS
  Administered 2018-04-16: 40 mg via INTRAVENOUS

## 2018-04-16 MED ORDER — SODIUM CHLORIDE 0.9 % IR SOLN
Status: DC | PRN
  Administered 2018-04-16 (×2): 3000 mL

## 2018-04-16 SURGICAL SUPPLY — 76 items
ALCOHOL 70% 16 OZ (MISCELLANEOUS) ×2 IMPLANT
ANCHOR SUT BIOCOMP CORKSREW (Anchor) ×4 IMPLANT
BLADE CUTTER GATOR 3.5 (BLADE) IMPLANT
BLADE GREAT WHITE 4.2 (BLADE) IMPLANT
BLADE SURG 11 STRL SS (BLADE) IMPLANT
BUR OVAL 6.0 (BURR) IMPLANT
CLSR STERI-STRIP ANTIMIC 1/2X4 (GAUZE/BANDAGES/DRESSINGS) ×2 IMPLANT
COVER SURGICAL LIGHT HANDLE (MISCELLANEOUS) ×2 IMPLANT
COVER WAND RF STERILE (DRAPES) ×2 IMPLANT
DRAPE INCISE IOBAN 66X45 STRL (DRAPES) ×4 IMPLANT
DRAPE STERI 35X30 U-POUCH (DRAPES) ×2 IMPLANT
DRAPE U-SHAPE 47X51 STRL (DRAPES) ×4 IMPLANT
DRSG TEGADERM 4X4.75 (GAUZE/BANDAGES/DRESSINGS) ×2 IMPLANT
DURAPREP 26ML APPLICATOR (WOUND CARE) ×4 IMPLANT
ELECT REM PT RETURN 9FT ADLT (ELECTROSURGICAL) ×2
ELECTRODE REM PT RTRN 9FT ADLT (ELECTROSURGICAL) ×1 IMPLANT
FILTER STRAW FLUID ASPIR (MISCELLANEOUS) ×2 IMPLANT
GAUZE SPONGE 4X4 12PLY STRL (GAUZE/BANDAGES/DRESSINGS) ×2 IMPLANT
GAUZE SPONGE 4X4 12PLY STRL LF (GAUZE/BANDAGES/DRESSINGS) ×2 IMPLANT
GAUZE XEROFORM 1X8 LF (GAUZE/BANDAGES/DRESSINGS) ×2 IMPLANT
GLOVE BIOGEL PI IND STRL 7.5 (GLOVE) ×1 IMPLANT
GLOVE BIOGEL PI IND STRL 8 (GLOVE) ×1 IMPLANT
GLOVE BIOGEL PI INDICATOR 7.5 (GLOVE) ×1
GLOVE BIOGEL PI INDICATOR 8 (GLOVE) ×1
GLOVE ECLIPSE 7.0 STRL STRAW (GLOVE) ×2 IMPLANT
GLOVE SURG ORTHO 8.0 STRL STRW (GLOVE) ×2 IMPLANT
GOWN STRL REUS W/ TWL LRG LVL3 (GOWN DISPOSABLE) ×3 IMPLANT
GOWN STRL REUS W/TWL LRG LVL3 (GOWN DISPOSABLE) ×3
HYDROGEN PEROXIDE 16OZ (MISCELLANEOUS) IMPLANT
KIT BASIN OR (CUSTOM PROCEDURE TRAY) ×2 IMPLANT
KIT BIO-TENODESIS 3X8 DISP (MISCELLANEOUS) ×1
KIT INSRT BABSR STRL DISP BTN (MISCELLANEOUS) ×1 IMPLANT
KIT TURNOVER KIT B (KITS) ×2 IMPLANT
MANIFOLD NEPTUNE II (INSTRUMENTS) ×2 IMPLANT
NDL SUT 6 .5 CRC .975X.05 MAYO (NEEDLE) ×1 IMPLANT
NEEDLE HYPO 25X1 1.5 SAFETY (NEEDLE) ×2 IMPLANT
NEEDLE MAYO TAPER (NEEDLE) ×1
NEEDLE SCORPION MULTI FIRE (NEEDLE) ×2 IMPLANT
NEEDLE SPNL 18GX3.5 QUINCKE PK (NEEDLE) ×2 IMPLANT
NS IRRIG 1000ML POUR BTL (IV SOLUTION) ×2 IMPLANT
PACK SHOULDER (CUSTOM PROCEDURE TRAY) ×2 IMPLANT
PAD ARMBOARD 7.5X6 YLW CONV (MISCELLANEOUS) ×4 IMPLANT
PROBE BIPOLAR ATHRO 135MM 90D (MISCELLANEOUS) ×2 IMPLANT
PUSHLOCK PEEK 4.5X24 (Orthopedic Implant) ×2 IMPLANT
RESTRAINT HEAD UNIVERSAL NS (MISCELLANEOUS) ×2 IMPLANT
SET ARTHROSCOPY TUBING (MISCELLANEOUS) ×1
SET ARTHROSCOPY TUBING LN (MISCELLANEOUS) ×1 IMPLANT
SLING ARM FOAM STRAP XLG (SOFTGOODS) ×2 IMPLANT
SLING ARM IMMOBILIZER LRG (SOFTGOODS) IMPLANT
SOL PREP POV-IOD 4OZ 10% (MISCELLANEOUS) ×2 IMPLANT
SOLUTION BETADINE 4OZ (MISCELLANEOUS) ×2 IMPLANT
SPONGE LAP 4X18 RFD (DISPOSABLE) ×2 IMPLANT
STRIP CLOSURE SKIN 1/2X4 (GAUZE/BANDAGES/DRESSINGS) ×2 IMPLANT
SUCTION FRAZIER HANDLE 10FR (MISCELLANEOUS) ×1
SUCTION TUBE FRAZIER 10FR DISP (MISCELLANEOUS) ×1 IMPLANT
SUT 2 FIBERLOOP 20 STRT BLUE (SUTURE)
SUT ETHILON 3 0 PS 1 (SUTURE) ×4 IMPLANT
SUT FIBERWIRE #2 38 T-5 BLUE (SUTURE)
SUT MNCRL AB 3-0 PS2 18 (SUTURE) ×2 IMPLANT
SUT VIC AB 0 CT1 27 (SUTURE) ×2
SUT VIC AB 0 CT1 27XBRD ANBCTR (SUTURE) ×2 IMPLANT
SUT VIC AB 1 CT1 27 (SUTURE) ×1
SUT VIC AB 1 CT1 27XBRD ANBCTR (SUTURE) ×1 IMPLANT
SUT VIC AB 2-0 CT1 27 (SUTURE) ×1
SUT VIC AB 2-0 CT1 TAPERPNT 27 (SUTURE) ×1 IMPLANT
SUT VICRYL 0 UR6 27IN ABS (SUTURE) ×2 IMPLANT
SUTURE 2 FIBERLOOP 20 STRT BLU (SUTURE) IMPLANT
SUTURE FIBERWR #2 38 T-5 BLUE (SUTURE) IMPLANT
SYR 20CC LL (SYRINGE) ×4 IMPLANT
SYR 30ML LL (SYRINGE) ×2 IMPLANT
SYR TB 1ML LUER SLIP (SYRINGE) ×2 IMPLANT
TOWEL OR 17X24 6PK STRL BLUE (TOWEL DISPOSABLE) ×2 IMPLANT
TOWEL OR 17X26 10 PK STRL BLUE (TOWEL DISPOSABLE) ×2 IMPLANT
WAND HAND CNTRL MULTIVAC 90 (MISCELLANEOUS) IMPLANT
WAND STAR VAC 90 (SURGICAL WAND) IMPLANT
WATER STERILE IRR 1000ML POUR (IV SOLUTION) ×2 IMPLANT

## 2018-04-16 NOTE — Brief Op Note (Signed)
04/16/2018  2:40 PM  PATIENT:  Melody Comas  62 y.o. male  PRE-OPERATIVE DIAGNOSIS:  right shoulder rotator cuff tear  POST-OPERATIVE DIAGNOSIS:  right shoulder rotator cuff tear  PROCEDURE:  Procedure(s): RIGHT SHOULDER ARTHROSCOPY SUBACROMIAL DECOMPRESSION, MINI OPEN ROTATOR CUFF TEAR REPAIR Debridement and biceps tenodesis SURGEON:  Surgeon(s): Cammy Copa, MD  ASSISTANT: c bethune rnfa  ANESTHESIA:   general  EBL: 25 ml    Total I/O In: 1500 [I.V.:1500] Out: 30 [Blood:30]  BLOOD ADMINISTERED: none  DRAINS: none   LOCAL MEDICATIONS USED:  none  SPECIMEN:  No Specimen  COUNTS:  YES  TOURNIQUET:  * No tourniquets in log *  DICTATION: .Other Dictation: Dictation Number [id number]  PLAN OF CARE: Discharge to home after PACU  PATIENT DISPOSITION:  PACU - hemodynamically stable

## 2018-04-16 NOTE — Anesthesia Procedure Notes (Signed)
Procedure Name: Intubation Date/Time: 04/16/2018 12:11 PM Performed by: Mayer Camel, CRNA Pre-anesthesia Checklist: Patient identified, Emergency Drugs available, Suction available and Patient being monitored Patient Re-evaluated:Patient Re-evaluated prior to induction Oxygen Delivery Method: Circle System Utilized Preoxygenation: Pre-oxygenation with 100% oxygen Induction Type: IV induction Ventilation: Mask ventilation without difficulty Laryngoscope Size: Miller and 3 Grade View: Grade I Tube type: Oral Number of attempts: 1 Airway Equipment and Method: Stylet and Oral airway Placement Confirmation: ETT inserted through vocal cords under direct vision,  positive ETCO2 and breath sounds checked- equal and bilateral Secured at: 23 cm Tube secured with: Tape Dental Injury: Teeth and Oropharynx as per pre-operative assessment

## 2018-04-16 NOTE — Anesthesia Preprocedure Evaluation (Addendum)
Anesthesia Evaluation  Patient identified by MRN, date of birth, ID band Patient awake    Reviewed: Allergy & Precautions, NPO status , Patient's Chart, lab work & pertinent test results  History of Anesthesia Complications Negative for: history of anesthetic complications  Airway Mallampati: III  TM Distance: >3 FB Neck ROM: Full    Dental  (+) Dental Advisory Given, Teeth Intact   Pulmonary sleep apnea (partially compliant) and Continuous Positive Airway Pressure Ventilation ,    breath sounds clear to auscultation       Cardiovascular (-) anginanegative cardio ROS   Rhythm:Regular Rate:Normal     Neuro/Psych  Headaches,  Insomnia    GI/Hepatic negative GI ROS, Neg liver ROS,   Endo/Other   Obesity   Renal/GU negative Renal ROS     Musculoskeletal negative musculoskeletal ROS (+)   Abdominal   Peds  Hematology negative hematology ROS (+)   Anesthesia Other Findings   Reproductive/Obstetrics                            Anesthesia Physical Anesthesia Plan  ASA: II  Anesthesia Plan: General   Post-op Pain Management:  Regional for Post-op pain   Induction: Intravenous  PONV Risk Score and Plan: 2 and Treatment may vary due to age or medical condition, Ondansetron, Dexamethasone and Midazolam  Airway Management Planned: Oral ETT  Additional Equipment: None  Intra-op Plan:   Post-operative Plan: Extubation in OR  Informed Consent: I have reviewed the patients History and Physical, chart, labs and discussed the procedure including the risks, benefits and alternatives for the proposed anesthesia with the patient or authorized representative who has indicated his/her understanding and acceptance.   Dental advisory given  Plan Discussed with: CRNA and Anesthesiologist  Anesthesia Plan Comments:        Anesthesia Quick Evaluation

## 2018-04-16 NOTE — Anesthesia Procedure Notes (Signed)
Anesthesia Regional Block: Interscalene brachial plexus block   Pre-Anesthetic Checklist: ,, timeout performed, Correct Patient, Correct Site, Correct Laterality, Correct Procedure, Correct Position, site marked, Risks and benefits discussed,  Surgical consent,  Pre-op evaluation,  At surgeon's request and post-op pain management  Laterality: Right  Prep: chloraprep       Needles:  Injection technique: Single-shot  Needle Type: Echogenic Needle     Needle Length: 5cm  Needle Gauge: 21     Additional Needles:   Narrative:  Start time: 04/16/2018 11:33 AM End time: 04/16/2018 11:37 AM Injection made incrementally with aspirations every 5 mL.  Performed by: Personally  Anesthesiologist: Beryle Lathe, MD  Additional Notes: No pain on injection. No increased resistance to injection. Injection made in 5cc increments. Good needle visualization. Patient tolerated the procedure well.

## 2018-04-16 NOTE — H&P (Signed)
Kristopher Stout is an 62 y.o. male.   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  Woke up with the pain in May.  Has a history of prior injury but this is been a remote history.  In general this was in a traumatic event but he does describe progressive pain and weakness in that right shoulder.  MRI scan shows rotator cuff tear as well as some posterior labral fraying.  Mild AC joint arthritis is present but is not really symptomatic in this region.  Past Medical History:  Diagnosis Date  . Headache   . Hypogonadism male   . Insomnia   . Sleep apnea     Past Surgical History:  Procedure Laterality Date  . CHOLECYSTECTOMY  07/21/2017  . ELBOW SURGERY Left 1999  . NASAL SINUS SURGERY  1993    Family History  Problem Relation Age of Onset  . Other Neg Hx        hypogonadism   Social History:  reports that he has never smoked. He has never used smokeless tobacco. He reports that he drinks alcohol. He reports that he does not use drugs.  Allergies:  Allergies  Allergen Reactions  . Metronidazole Diarrhea, Rash and Other (See Comments)    Bloody stools  . Zyrtec [Cetirizine] Palpitations    No medications prior to admission.    No results found for this or any previous visit (from the past 48 hour(s)). No results found.  Review of Systems  Musculoskeletal: Positive for joint pain.  All other systems reviewed and are negative.   There were no vitals taken for this visit. Physical Exam  Constitutional: He appears well-developed.  HENT:  Head: Normocephalic.  Eyes: Pupils are equal, round, and reactive to light.  Neck: Normal range of motion.  Cardiovascular: Normal rate.  Respiratory: Effort normal.  Neurological: He is alert.  Skin: Skin is warm.  Psychiatric: He has a normal mood and affect.  Examination of the right shoulder demonstrates maintained passive range of motion.  No AC joint tenderness.  Some  weakness to supraspinatus testing.  No restriction of external rotation of 15 degrees of abduction.  O'Brien's testing is equivocal.  Assessment/Plan Impression is right shoulder rotator cuff tear.  There is posterior labral fraying on MRI scan.  The tear itself is minimally retracted but he is having some weakness.  Plan is arthroscopy with evaluation of the biceps tendon possible labral debridement and subsequent mini open rotator cuff tear repair with CPM use postop.  Risk and benefits are discussed.  They include but not limited to infection nerve vessel damage shoulder stiffness as well as somewhat prolonged healing time.  All questions answered.  He will be in a sling for about 1 to 2 weeks and then avoid heavy lifting or any lifting with that shoulder for the first 6 weeks.  Burnard Bunting, MD 04/16/2018, 8:37 AM

## 2018-04-16 NOTE — Transfer of Care (Signed)
Immediate Anesthesia Transfer of Care Note  Patient: Kristopher Stout  Procedure(s) Performed: RIGHT SHOULDER ARTHROSCOPY SUBACROMIAL DECOMPRESSION, MINI OPEN ROTATOR CUFF TEAR REPAIR (Right )  Patient Location: PACU  Anesthesia Type:GA combined with regional for post-op pain  Level of Consciousness: drowsy and patient cooperative  Airway & Oxygen Therapy: Patient Spontanous Breathing and Patient connected to nasal cannula oxygen  Post-op Assessment: Report given to RN and Post -op Vital signs reviewed and stable  Post vital signs: Reviewed and stable  Last Vitals:  Vitals Value Taken Time  BP 147/91 04/16/2018  2:41 PM  Temp    Pulse 92 04/16/2018  2:42 PM  Resp 15 04/16/2018  2:42 PM  SpO2 97 % 04/16/2018  2:42 PM  Vitals shown include unvalidated device data.  Last Pain:  Vitals:   04/16/18 1055  TempSrc:   PainSc: 2       Patients Stated Pain Goal: 2 (04/16/18 1055)  Complications: No apparent anesthesia complications

## 2018-04-16 NOTE — Op Note (Signed)
NAME: Kristopher Stout, Kristopher Stout MEDICAL RECORD ZO:[id number] ACCOUNT [id number] DATE OF BIRTH:06/22/1955 FACILITY: [facility] LOCATION: [facility] PHYSICIAN:Torunn Chancellor Diamantina Providence, MD  OPERATIVE REPORT  DATE OF PROCEDURE:  04/16/2018  PREOPERATIVE DIAGNOSIS:  Right shoulder rotator cuff tear.  POSTOPERATIVE DIAGNOSIS:  Right shoulder rotator cuff tear and labral tear with instability of the biceps anchor.  PROCEDURE:  Right shoulder arthroscopy with labral debridement and biceps tendon release, mini open rotator cuff tear repair with subacromial decompression and biceps tenodesis.  SURGEON:  Cammy Copa, MD.  ASSISTANT:  Patrick Jupiter, RNFA.  INDICATIONS:  This is a patient with right shoulder pain.  MRI scan showed rotator cuff tear.  He presents for operative management after explanation of risks and benefits.  OPERATIVE FINDINGS: 1.  Examination under anesthesia, range of motion, the patient had full forward flexion and abduction with external rotation at 15 degrees of abduction to about 65 degrees. 2.  Diagnostic and operative arthroscopy.  The patient had intact glenohumeral articular surfaces with only mild grade I changes over about 50% of the humeral head. 3.  No significant posterior labral pathology, but there was a type 2 SLAP tear which extended down to and included the anterior one-fourth of the capsule labral complex. 4.  A 2 x 2 cm tear of the rotator cuff tendon with some retraction. 5.  Intact anterior inferior and posterior inferior glenohumeral ligaments.  PROCEDURE IN DETAIL:  The patient was brought to the operating room where general anesthetic was induced.  Preoperative antibiotics administered.  Timeout was called.  The patient was placed in the beach chair position with the head in neutral position.   Right shoulder examined under anesthesia, then prescrubbed with alcohol and Betadine, allowed to air dry.  Prep with DuraPrep solution and draped in a sterile manner.  [name] was  used to cover the axilla.  Timeout was called.  Posterior portal was  created 2 cm medial and inferior to the posterolateral margin of the acromion.  Anterior portal created under direct visualization.  Diagnostic arthroscopy was performed. The patient did have an anterior superior labral detachment with fraying consistent  with a chronic on acute injury.  There was also a rotator cuff tear 2 x 2 cm involving the supraspinatus tendon.  Infraspinatus was intact.  Anterior, inferior, posterior inferior glenohumeral ligaments were intact.  Only mild degenerative changes noted  on the humeral head.  At this time, through the anterior portal, a debridement and biceps tendon release was performed with both the Arthrocare wand and the shaver.  A portal reversal was performed and the posterior labrum was intact.  Following limited  superior labral debridement and the biceps tendon release and evaluation of the rotator cuff, the instruments were removed.  Portals closed using 3-0 nylon.  Ioban then used to cover the entire operative field.  An incision was made about a 4 cm off the  anterolateral margin of the acromion.  The deltoid was then split between the anterior and medial raphae.  Stay suture was placed about 4 cm from the anterolateral margin of the acromion.  At this time, bursectomy was performed.  Subacromial  decompression was also performed with a rasp.  The patient did have a fairly thickened bursa.  The rotator cuff tear was then visualized.  Then, 0 Vicryl stay sutures were placed in the end of the rotator cuff tendon tear.  Debridement was then performed  on the footprint using both a #10 blade and a rongeur and a curette.  Following  this, the biceps tendon was tenodesed into the bicipital groove using an Arthrex SwiveLock 7 mm.  This gave good stable fixation of the tendon.  This was a secure fixation  that was performed within the bicipital groove.  At this time, attention was redirected towards  the rotator cuff tendon tear.  The tendon was brought into good position and with 2 Arthrex corkscrew anchors and 2 PushLocks a watertight repair was  achieved.  The arm was taken through a range of motion and no pinching or impingement was present.  Thorough irrigation was then performed and deltoid split was closed using #1 Vicryl suture followed by interrupted inverted 0 Vicryl suture, 2-0 Vicryl  suture and a 3-0 Monocryl.  Aquacel dressings were placed.  The patient tolerated the procedure well without immediate complications.  He was transferred to the recovery room in stable condition.  A large shoulder immobilizer was placed.  TN/NUANCE  D:04/16/2018 T:04/16/2018 JOB:[id number]

## 2018-04-16 NOTE — Anesthesia Postprocedure Evaluation (Signed)
Anesthesia Post Note  Patient: PETRO TALENT  Procedure(s) Performed: RIGHT SHOULDER ARTHROSCOPY SUBACROMIAL DECOMPRESSION, MINI OPEN ROTATOR CUFF TEAR REPAIR (Right )     Patient location during evaluation: PACU Anesthesia Type: General Level of consciousness: awake and alert Pain management: pain level controlled Vital Signs Assessment: post-procedure vital signs reviewed and stable Respiratory status: spontaneous breathing, nonlabored ventilation and respiratory function stable Cardiovascular status: blood pressure returned to baseline and stable Postop Assessment: no apparent nausea or vomiting (Nausea improved with phenergan and haldol) Anesthetic complications: no    Last Vitals:  Vitals:   04/16/18 1511 04/16/18 1526  BP: (!) 142/91 138/88  Pulse: 93 92  Resp: 16 17  Temp:    SpO2: 100% 94%    Last Pain:  Vitals:   04/16/18 1510  TempSrc:   PainSc: 0-No pain                 Beryle Lathe

## 2018-04-17 ENCOUNTER — Telehealth (INDEPENDENT_AMBULATORY_CARE_PROVIDER_SITE_OTHER): Payer: Self-pay | Admitting: Orthopedic Surgery

## 2018-04-17 ENCOUNTER — Inpatient Hospital Stay (INDEPENDENT_AMBULATORY_CARE_PROVIDER_SITE_OTHER): Payer: 59 | Admitting: Orthopedic Surgery

## 2018-04-17 ENCOUNTER — Encounter (HOSPITAL_COMMUNITY): Payer: Self-pay | Admitting: Orthopedic Surgery

## 2018-04-17 NOTE — Telephone Encounter (Signed)
Any restrictions?

## 2018-04-17 NOTE — Telephone Encounter (Signed)
Patient calling regarding instructions for his arm/shoulder when he takes a shower (today).  Does he hold the arm close to him as though he is wearing a sling or allow the arm to hang loosely.  He states the block is still on.  Please call patient and advise.    cb [phone]

## 2018-04-17 NOTE — Telephone Encounter (Signed)
I called.

## 2018-04-20 ENCOUNTER — Encounter (HOSPITAL_COMMUNITY): Payer: Self-pay | Admitting: Orthopedic Surgery

## 2018-04-22 ENCOUNTER — Telehealth (INDEPENDENT_AMBULATORY_CARE_PROVIDER_SITE_OTHER): Payer: Self-pay | Admitting: Orthopedic Surgery

## 2018-04-22 NOTE — Telephone Encounter (Signed)
Patient called has surgery related questions

## 2018-04-22 NOTE — Telephone Encounter (Signed)
IC patient. 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

## 2018-04-24 ENCOUNTER — Ambulatory Visit (INDEPENDENT_AMBULATORY_CARE_PROVIDER_SITE_OTHER): Payer: 59 | Admitting: Orthopedic Surgery

## 2018-04-24 ENCOUNTER — Encounter (INDEPENDENT_AMBULATORY_CARE_PROVIDER_SITE_OTHER): Payer: Self-pay | Admitting: Orthopedic Surgery

## 2018-04-24 DIAGNOSIS — S46011A Strain of muscle(s) and tendon(s) of the rotator cuff of right shoulder, initial encounter: Secondary | ICD-10-CM

## 2018-04-24 NOTE — Progress Notes (Signed)
   Post-Op Visit Note   Patient: Kristopher Stout           Date of Birth: 06/02/1956           MRN: [id number] Visit Date: 04/24/2018 PCP: Wilburn MylarKelly, Samuel S, MD   Assessment & Plan:  Chief Complaint:  Chief Complaint  Patient presents with  . Right Shoulder - Routine Post Op   Visit Diagnoses:  1. Traumatic complete tear of right rotator cuff, initial encounter     Plan: Kristopher MaduroRobert is now a week out right shoulder arthroscopy with cuff repair and biceps tenodesis.  CPM is at 682.  On exam he still is a little bit off on external rotation but there is no coarse grinding or crepitus with passive range of motion of the shoulder.  We will start him in physical therapy at the end of next week and continue with the CPM machine for now.  I will see him back in 2 weeks and will likely discontinue the sling use at that time.  He has gone back to work and is not taking much in the way of pain medicine  Follow-Up Instructions: Return in about 2 weeks (around 05/08/2018).   Orders:  Orders Placed This Encounter  Procedures  . Ambulatory referral to Physical Therapy   No orders of the defined types were placed in this encounter.   Imaging: No results found.  PMFS History: Patient Active Problem List   Diagnosis Date Noted  . Hypogonadism male   . Headache   . Insomnia   . Screening for prostate cancer 09/27/2016  . Hypogonadism, male 05/16/2015   Past Medical History:  Diagnosis Date  . Headache   . Hypogonadism male   . Insomnia   . Sleep apnea     Family History  Problem Relation Age of Onset  . Other Neg Hx        hypogonadism    Past Surgical History:  Procedure Laterality Date  . CHOLECYSTECTOMY  07/21/2017  . ELBOW SURGERY Left 1999  . NASAL SINUS SURGERY  1993  . SHOULDER ARTHROSCOPY WITH ROTATOR CUFF REPAIR AND SUBACROMIAL DECOMPRESSION Right 04/16/2018   Procedure: RIGHT SHOULDER ARTHROSCOPY SUBACROMIAL DECOMPRESSION, MINI OPEN ROTATOR CUFF TEAR REPAIR;  Surgeon:  Cammy Copaean, Arianna Haydon Scott, MD;  [facility];  Service: Orthopedics;  Laterality: Right;   Social History   Occupational History  . Not on file  Tobacco Use  . Smoking status: Never Smoker  . Smokeless tobacco: Never Used  Substance and Sexual Activity  . Alcohol use: Yes    Alcohol/week: 0.0 standard drinks    Comment: 1 beer at night on weekends  . Drug use: Never  . Sexual activity: Not on file

## 2018-05-04 DIAGNOSIS — S46011A Strain of muscle(s) and tendon(s) of the rotator cuff of right shoulder, initial encounter: Secondary | ICD-10-CM

## 2018-05-04 DIAGNOSIS — M65811 Other synovitis and tenosynovitis, right shoulder: Secondary | ICD-10-CM

## 2018-05-11 ENCOUNTER — Encounter (INDEPENDENT_AMBULATORY_CARE_PROVIDER_SITE_OTHER): Payer: Self-pay | Admitting: Orthopedic Surgery

## 2018-05-11 ENCOUNTER — Ambulatory Visit (INDEPENDENT_AMBULATORY_CARE_PROVIDER_SITE_OTHER): Payer: 59 | Admitting: Orthopedic Surgery

## 2018-05-11 DIAGNOSIS — S46011A Strain of muscle(s) and tendon(s) of the rotator cuff of right shoulder, initial encounter: Secondary | ICD-10-CM

## 2018-05-11 NOTE — Progress Notes (Signed)
   Post-Op Visit Note   Patient: Kristopher Stout           Date of Birth: 06/07/1956           MRN: [id number] Visit Date: 05/11/2018 PCP: Wilburn MylarKelly, Samuel S, MD   Assessment & Plan:  Chief Complaint:  Chief Complaint  Patient presents with  . Right Shoulder - Pain, Follow-up   Visit Diagnoses:  1. Traumatic complete tear of right rotator cuff, initial encounter     Plan: [name] is now 4 weeks out right shoulder arthroscopy with biceps tenodesis and mini open rotator cuff repair.  He is doing well.  He is at 132 on the CPM machine.  On examination he has good passive range of motion and very smooth feeling passive motion where the cuff is been repaired.  He has been doing a lot of stretching exercises at home as well.  He has been in the sling full-time.  At this time I think he is fine to come out of the sling but not do any lifting with that right arm.  I will see him back in about 4 weeks.  Okay to start strengthening in 2 weeks.  He is not taking any pain meds.  Follow-Up Instructions: Return in about 4 weeks (around 06/08/2018).   Orders:  No orders of the defined types were placed in this encounter.  No orders of the defined types were placed in this encounter.   Imaging: No results found.  PMFS History: Patient Active Problem List   Diagnosis Date Noted  . Traumatic complete tear of right rotator cuff   . Other synovitis and tenosynovitis, right shoulder   . Hypogonadism male   . Headache   . Insomnia   . Screening for prostate cancer 09/27/2016  . Hypogonadism, male 05/16/2015   Past Medical History:  Diagnosis Date  . Headache   . Hypogonadism male   . Insomnia   . Sleep apnea     Family History  Problem Relation Age of Onset  . Other Neg Hx        hypogonadism    Past Surgical History:  Procedure Laterality Date  . CHOLECYSTECTOMY  07/21/2017  . ELBOW SURGERY Left 1999  . NASAL SINUS SURGERY  1993  . SHOULDER ARTHROSCOPY WITH ROTATOR CUFF REPAIR AND  SUBACROMIAL DECOMPRESSION Right 04/16/2018   Procedure: RIGHT SHOULDER ARTHROSCOPY SUBACROMIAL DECOMPRESSION, MINI OPEN ROTATOR CUFF TEAR REPAIR;  Surgeon: Cammy Copaean, Verdie Wilms Scott, MD;  [facility];  Service: Orthopedics;  Laterality: Right;   Social History   Occupational History  . Not on file  Tobacco Use  . Smoking status: Never Smoker  . Smokeless tobacco: Never Used  Substance and Sexual Activity  . Alcohol use: Yes    Alcohol/week: 0.0 standard drinks    Comment: 1 beer at night on weekends  . Drug use: Never  . Sexual activity: Not on file

## 2018-05-24 ENCOUNTER — Other Ambulatory Visit: Payer: Self-pay | Admitting: Endocrinology

## 2018-06-08 ENCOUNTER — Ambulatory Visit (INDEPENDENT_AMBULATORY_CARE_PROVIDER_SITE_OTHER): Payer: 59 | Admitting: Orthopedic Surgery

## 2018-06-11 ENCOUNTER — Ambulatory Visit (INDEPENDENT_AMBULATORY_CARE_PROVIDER_SITE_OTHER): Payer: 59 | Admitting: Orthopedic Surgery

## 2018-06-11 ENCOUNTER — Encounter (INDEPENDENT_AMBULATORY_CARE_PROVIDER_SITE_OTHER): Payer: Self-pay | Admitting: Orthopedic Surgery

## 2018-06-11 DIAGNOSIS — S46011A Strain of muscle(s) and tendon(s) of the rotator cuff of right shoulder, initial encounter: Secondary | ICD-10-CM

## 2018-06-14 ENCOUNTER — Encounter (INDEPENDENT_AMBULATORY_CARE_PROVIDER_SITE_OTHER): Payer: Self-pay | Admitting: Orthopedic Surgery

## 2018-06-14 NOTE — Progress Notes (Signed)
   Post-Op Visit Note   Patient: Kristopher Stout           Date of Birth: 09/20/1955           MRN: [id number] Visit Date: 06/11/2018 PCP: Wilburn Mylar, MD   Assessment & Plan:  Chief Complaint:  Chief Complaint  Patient presents with  . Right Shoulder - Follow-up   Visit Diagnoses:  1. Traumatic complete tear of right rotator cuff, initial encounter     Plan: Kristopher Stout is a patient is now about 7 weeks out right shoulder arthroscopy with mini open rotator cuff repair biceps tenodesis.  He is doing some resistive work in physical therapy.  He is able to sleep on that side.  On examination he has improving abduction and forward flexion strength.  No coarse grinding or crepitus with active or passive range of motion.  I am going to let him start working with the elliptical.  Continue with strengthening exercises.  Okay to go back to work.  5-week return for clinical recheck.  Follow-Up Instructions: Return in about 5 weeks (around 07/16/2018).   Orders:  No orders of the defined types were placed in this encounter.  No orders of the defined types were placed in this encounter.   Imaging: No results found.  PMFS History: Patient Active Problem List   Diagnosis Date Noted  . Traumatic complete tear of right rotator cuff   . Other synovitis and tenosynovitis, right shoulder   . Hypogonadism male   . Headache   . Insomnia   . Screening for prostate cancer 09/27/2016  . Hypogonadism, male 05/16/2015   Past Medical History:  Diagnosis Date  . Headache   . Hypogonadism male   . Insomnia   . Sleep apnea     Family History  Problem Relation Age of Onset  . Other Neg Hx        hypogonadism    Past Surgical History:  Procedure Laterality Date  . CHOLECYSTECTOMY  07/21/2017  . ELBOW SURGERY Left 1999  . NASAL SINUS SURGERY  1993  . SHOULDER ARTHROSCOPY WITH ROTATOR CUFF REPAIR AND SUBACROMIAL DECOMPRESSION Right 04/16/2018   Procedure: RIGHT SHOULDER ARTHROSCOPY SUBACROMIAL  DECOMPRESSION, MINI OPEN ROTATOR CUFF TEAR REPAIR;  Surgeon: Cammy Copa, MD;  Location: [facility] OR;  Service: Orthopedics;  Laterality: Right;   Social History   Occupational History  . Not on file  Tobacco Use  . Smoking status: Never Smoker  . Smokeless tobacco: Never Used  Substance and Sexual Activity  . Alcohol use: Yes    Alcohol/week: 0.0 standard drinks    Comment: 1 beer at night on weekends  . Drug use: Never  . Sexual activity: Not on file

## 2018-06-24 ENCOUNTER — Encounter (INDEPENDENT_AMBULATORY_CARE_PROVIDER_SITE_OTHER): Payer: Self-pay | Admitting: Orthopedic Surgery

## 2018-06-24 ENCOUNTER — Ambulatory Visit (INDEPENDENT_AMBULATORY_CARE_PROVIDER_SITE_OTHER): Payer: 59

## 2018-06-24 ENCOUNTER — Ambulatory Visit (INDEPENDENT_AMBULATORY_CARE_PROVIDER_SITE_OTHER): Payer: 59 | Admitting: Orthopedic Surgery

## 2018-06-24 DIAGNOSIS — M25511 Pain in right shoulder: Secondary | ICD-10-CM | POA: Diagnosis not present

## 2018-06-26 ENCOUNTER — Encounter (INDEPENDENT_AMBULATORY_CARE_PROVIDER_SITE_OTHER): Payer: Self-pay | Admitting: Orthopedic Surgery

## 2018-06-26 NOTE — Progress Notes (Signed)
   Post-Op Visit Note   Patient: Kristopher Stout           Date of Birth: 07/11/1955           MRN: [id number] Visit Date: 06/24/2018 PCP: Wilburn MylarKelly, Samuel S, MD   Assessment & Plan:  Chief Complaint:  Chief Complaint  Patient presents with  . Right Shoulder - Injury   Visit Diagnoses:  1. Acute pain of right shoulder     Plan: Kristopher Stout is a patient who had right shoulder cuff repair done 2 months ago.  He fell 06/17/2018 on that right shoulder.  Is having little bit of different type pain.  On exam he does have pretty smooth active and passive range of motion with no coarseness.  Plan at this time is observation and delaying therapy for 1 week.  Keep appointment for February 5.  I do not think he has torn the rotator cuff repair.  His range of motion and strength are pretty comparable to where he was prior to his fall  Follow-Up Instructions: No follow-ups on file.   Orders:  Orders Placed This Encounter  Procedures  . XR Shoulder Right   No orders of the defined types were placed in this encounter.   Imaging: No results found.  PMFS History: Patient Active Problem List   Diagnosis Date Noted  . Traumatic complete tear of right rotator cuff   . Other synovitis and tenosynovitis, right shoulder   . Hypogonadism male   . Headache   . Insomnia   . Screening for prostate cancer 09/27/2016  . Hypogonadism, male 05/16/2015   Past Medical History:  Diagnosis Date  . Headache   . Hypogonadism male   . Insomnia   . Sleep apnea     Family History  Problem Relation Age of Onset  . Other Neg Hx        hypogonadism    Past Surgical History:  Procedure Laterality Date  . CHOLECYSTECTOMY  07/21/2017  . ELBOW SURGERY Left 1999  . NASAL SINUS SURGERY  1993  . SHOULDER ARTHROSCOPY WITH ROTATOR CUFF REPAIR AND SUBACROMIAL DECOMPRESSION Right 04/16/2018   Procedure: RIGHT SHOULDER ARTHROSCOPY SUBACROMIAL DECOMPRESSION, MINI OPEN ROTATOR CUFF TEAR REPAIR;  Surgeon: Cammy Copaean, Brad Mcgaughy  Scott, MD;  [facility];  Service: Orthopedics;  Laterality: Right;   Social History   Occupational History  . Not on file  Tobacco Use  . Smoking status: Never Smoker  . Smokeless tobacco: Never Used  Substance and Sexual Activity  . Alcohol use: Yes    Alcohol/week: 0.0 standard drinks    Comment: 1 beer at night on weekends  . Drug use: Never  . Sexual activity: Not on file

## 2018-07-15 ENCOUNTER — Ambulatory Visit (INDEPENDENT_AMBULATORY_CARE_PROVIDER_SITE_OTHER): Payer: 59 | Admitting: Orthopedic Surgery

## 2018-07-15 DIAGNOSIS — S46011A Strain of muscle(s) and tendon(s) of the rotator cuff of right shoulder, initial encounter: Secondary | ICD-10-CM

## 2018-07-15 DIAGNOSIS — Z4789 Encounter for other orthopedic aftercare: Secondary | ICD-10-CM

## 2018-07-16 ENCOUNTER — Encounter (INDEPENDENT_AMBULATORY_CARE_PROVIDER_SITE_OTHER): Payer: Self-pay | Admitting: Orthopedic Surgery

## 2018-07-16 NOTE — Progress Notes (Signed)
   Post-Op Visit Note   Patient: Kristopher Stout           Date of Birth: 06-Nov-1955           MRN: [id number] Visit Date: 07/15/2018 PCP: Wilburn Mylar, MD   Assessment & Plan:  Chief Complaint:  Chief Complaint  Patient presents with  . Right Shoulder - Follow-up   Visit Diagnoses:  1. Traumatic complete tear of right rotator cuff, initial encounter     Plan: [name] is a patient is now 3 months out from right shoulder rotator cuff repair.  He is been doing well.  He has been in physical therapy.  He states he is about 90%.  On exam he does have improving forward flexion and abduction and good strength.  Lacks about 30 degrees of full forward flexion on the right compared to the left.  On him to continue in physical therapy for strengthening.  In general I want to be cautious for about the next month with excessive lifting.  I will see him back as needed.  Follow-Up Instructions: Return if symptoms worsen or fail to improve.   Orders:  No orders of the defined types were placed in this encounter.  No orders of the defined types were placed in this encounter.   Imaging: No results found.  PMFS History: Patient Active Problem List   Diagnosis Date Noted  . Traumatic complete tear of right rotator cuff   . Other synovitis and tenosynovitis, right shoulder   . Hypogonadism male   . Headache   . Insomnia   . Screening for prostate cancer 09/27/2016  . Hypogonadism, male 05/16/2015   Past Medical History:  Diagnosis Date  . Headache   . Hypogonadism male   . Insomnia   . Sleep apnea     Family History  Problem Relation Age of Onset  . Other Neg Hx        hypogonadism    Past Surgical History:  Procedure Laterality Date  . CHOLECYSTECTOMY  07/21/2017  . ELBOW SURGERY Left 1999  . NASAL SINUS SURGERY  1993  . SHOULDER ARTHROSCOPY WITH ROTATOR CUFF REPAIR AND SUBACROMIAL DECOMPRESSION Right 04/16/2018   Procedure: RIGHT SHOULDER ARTHROSCOPY SUBACROMIAL  DECOMPRESSION, MINI OPEN ROTATOR CUFF TEAR REPAIR;  Surgeon: Cammy Copa, MD;  Location: [facility] OR;  Service: Orthopedics;  Laterality: Right;   Social History   Occupational History  . Not on file  Tobacco Use  . Smoking status: Never Smoker  . Smokeless tobacco: Never Used  Substance and Sexual Activity  . Alcohol use: Yes    Alcohol/week: 0.0 standard drinks    Comment: 1 beer at night on weekends  . Drug use: Never  . Sexual activity: Not on file

## 2018-09-25 ENCOUNTER — Ambulatory Visit: Payer: 59 | Admitting: Endocrinology

## 2018-09-28 ENCOUNTER — Ambulatory Visit (INDEPENDENT_AMBULATORY_CARE_PROVIDER_SITE_OTHER): Payer: 59 | Admitting: Endocrinology

## 2018-09-28 ENCOUNTER — Other Ambulatory Visit: Payer: Self-pay

## 2018-09-28 ENCOUNTER — Encounter: Payer: Self-pay | Admitting: Endocrinology

## 2018-09-28 VITALS — BP 136/82 | HR 110 | Ht 71.0 in | Wt 264.8 lb

## 2018-09-28 DIAGNOSIS — R5383 Other fatigue: Secondary | ICD-10-CM | POA: Diagnosis not present

## 2018-09-28 DIAGNOSIS — E291 Testicular hypofunction: Secondary | ICD-10-CM | POA: Diagnosis not present

## 2018-09-28 DIAGNOSIS — Z125 Encounter for screening for malignant neoplasm of prostate: Secondary | ICD-10-CM | POA: Diagnosis not present

## 2018-09-28 LAB — CBC WITH DIFFERENTIAL/PLATELET
Basophils Absolute: 0 10*3/uL (ref 0.0–0.1)
Basophils Relative: 0.9 % (ref 0.0–3.0)
Eosinophils Absolute: 0.3 10*3/uL (ref 0.0–0.7)
Eosinophils Relative: 6.4 % — ABNORMAL HIGH (ref 0.0–5.0)
HCT: 42.8 % (ref 39.0–52.0)
Hemoglobin: 14.9 g/dL (ref 13.0–17.0)
Lymphocytes Relative: 34.2 % (ref 12.0–46.0)
Lymphs Abs: 1.8 10*3/uL (ref 0.7–4.0)
MCHC: 34.8 g/dL (ref 30.0–36.0)
MCV: 95.5 fl (ref 78.0–100.0)
Monocytes Absolute: 0.4 10*3/uL (ref 0.1–1.0)
Monocytes Relative: 8.2 % (ref 3.0–12.0)
Neutro Abs: 2.6 10*3/uL (ref 1.4–7.7)
Neutrophils Relative %: 50.3 % (ref 43.0–77.0)
Platelets: 204 10*3/uL (ref 150.0–400.0)
RBC: 4.48 Mil/uL (ref 4.22–5.81)
RDW: 13.1 % (ref 11.5–15.5)
WBC: 5.2 10*3/uL (ref 4.0–10.5)

## 2018-09-28 LAB — PSA: PSA: 1.98 ng/mL (ref 0.10–4.00)

## 2018-09-28 LAB — TSH: TSH: 1.37 u[IU]/mL (ref 0.35–4.50)

## 2018-09-28 MED ORDER — CLOMIPHENE CITRATE 50 MG PO TABS: ORAL_TABLET | ORAL | 3 refills | Status: DC

## 2018-09-28 NOTE — Progress Notes (Signed)
Subjective:    Patient ID: Kristopher Stout, male    DOB: [birth year], 63 y.o.   MRN: [id number]  HPI Pt returns for f/u of idiopathic central hypogonadism (dx'ed 2015; he has 2 biological children; he says he has never taken illicit androgens; he was first rx'ed androgel, but was successfully changed to clomid).  ED sxs are unchanged.  He also has fatigue.   Past Medical History:  Diagnosis Date  . Headache   . Hypogonadism male   . Insomnia   . Sleep apnea     Past Surgical History:  Procedure Laterality Date  . CHOLECYSTECTOMY  07/21/2017  . ELBOW SURGERY Left 1999  . NASAL SINUS SURGERY  1993  . SHOULDER ARTHROSCOPY WITH ROTATOR CUFF REPAIR AND SUBACROMIAL DECOMPRESSION Right 04/16/2018   Procedure: RIGHT SHOULDER ARTHROSCOPY SUBACROMIAL DECOMPRESSION, MINI OPEN ROTATOR CUFF TEAR REPAIR;  Surgeon: Cammy Copa, MD;  Location: [facility] OR;  Service: Orthopedics;  Laterality: Right;    Social History   Socioeconomic History  . Marital status: Married    Spouse name: Not on file  . Number of children: Not on file  . Years of education: Not on file  . Highest education level: Not on file  Occupational History  . Not on file  Social Needs  . Financial resource strain: Not on file  . Food insecurity:    Worry: Not on file    Inability: Not on file  . Transportation needs:    Medical: Not on file    Non-medical: Not on file  Tobacco Use  . Smoking status: Never Smoker  . Smokeless tobacco: Never Used  Substance and Sexual Activity  . Alcohol use: Yes    Alcohol/week: 0.0 standard drinks    Comment: 1 beer at night on weekends  . Drug use: Never  . Sexual activity: Not on file  Lifestyle  . Physical activity:    Days per week: Not on file    Minutes per session: Not on file  . Stress: Not on file  Relationships  . Social connections:    Talks on phone: Not on file    Gets together: Not on file    Attends religious service: Not on file    Active member of club  or organization: Not on file    Attends meetings of clubs or organizations: Not on file    Relationship status: Not on file  . Intimate partner violence:    Fear of current or ex partner: Not on file    Emotionally abused: Not on file    Physically abused: Not on file    Forced sexual activity: Not on file  Other Topics Concern  . Not on file  Social History Narrative  . Not on file    Current Outpatient Medications on File Prior to Visit  Medication Sig Dispense Refill  . diclofenac (VOLTAREN) 50 MG EC tablet Take 50 mg by mouth 2 (two) times daily.    . fluticasone (FLONASE) 50 MCG/ACT nasal spray Place 2 sprays into both nostrils daily.     . Glucosamine-Chondroitin (OSTEO BI-FLEX REGULAR STRENGTH PO) Take 1 tablet by mouth 2 (two) times daily.    . meclizine (ANTIVERT) 25 MG tablet Take 25 mg by mouth 3 (three) times daily as needed for dizziness.     . Multiple Vitamins-Minerals (CENTRUM SILVER PO) Take 1 tablet by mouth daily.    . Omega-3 Fatty Acids (FISH OIL PO) Take 1 capsule by mouth daily.    Marland Kitchen  promethazine (PHENERGAN) 12.5 MG tablet Take 12.5 mg by mouth every 6 (six) hours as needed for nausea or vomiting.  2  . SUMAtriptan (IMITREX) 20 MG/ACT nasal spray Place 20 mg into the nose every 2 (two) hours as needed for migraine or headache. May repeat in 2 hours if headache persists or recurs.    . tamsulosin (FLOMAX) 0.4 MG CAPS capsule Take 0.4 mg by mouth every evening.    . zolpidem (AMBIEN CR) 12.5 MG CR tablet Take 12.5 mg by mouth at bedtime as needed for sleep.     No current facility-administered medications on file prior to visit.     Allergies  Allergen Reactions  . Metronidazole Diarrhea, Rash and Other (See Comments)    Bloody stools  . Zyrtec [Cetirizine] Palpitations    Family History  Problem Relation Age of Onset  . Other Neg Hx        hypogonadism    BP 136/82 (BP Location: Right Arm, Patient Position: Sitting, Cuff Size: Large)   Pulse (!) 110    Ht 5\' 11"  (1.803 m)   Wt 264 lb 12.8 oz (120.1 kg)   SpO2 97%   BMI 36.93 kg/m    Review of Systems He has gained weight.      Objective:   Physical Exam VITAL SIGNS:  See vs page GENERAL: no distress.   Ext: no leg edema.    Lab Results  Component Value Date   TESTOSTERONE 342 09/28/2018   Lab Results  Component Value Date   WBC 5.2 09/28/2018   HGB 14.9 09/28/2018   HCT 42.8 09/28/2018   MCV 95.5 09/28/2018   PLT 204.0 09/28/2018      Assessment & Plan:  Hypogonadism: well-controlled Fatigue: not testosterone-related.    Patient Instructions  Blood tests are requested for you today.  We'll let you know about the results.  Please come back for a follow-up appointment in 1 year. Testosterone treatment has risks, including increased or decreased fertility (depending on the type of treatment), hair loss, prostate cancer, benign prostate enlargement, blood clots, liver problems, lower hdl ("good cholesterol"), polycythemia (opposite of anemia), sleep apnea, and behavior changes.   Weight loss helps the testosterone, also.

## 2018-09-28 NOTE — Patient Instructions (Addendum)
Blood tests are requested for you today.  We'll let you know about the results.  Please come back for a follow-up appointment in 1 year. Testosterone treatment has risks, including increased or decreased fertility (depending on the type of treatment), hair loss, prostate cancer, benign prostate enlargement, blood clots, liver problems, lower hdl ("good cholesterol"), polycythemia (opposite of anemia), sleep apnea, and behavior changes.   Weight loss helps the testosterone, also.

## 2018-09-29 LAB — TESTOSTERONE,FREE AND TOTAL
Testosterone, Free: 8.4 pg/mL (ref 6.6–18.1)
Testosterone: 342 ng/dL (ref 264–916)

## 2019-09-28 ENCOUNTER — Encounter: Payer: Self-pay | Admitting: Endocrinology

## 2019-09-28 ENCOUNTER — Ambulatory Visit (INDEPENDENT_AMBULATORY_CARE_PROVIDER_SITE_OTHER): Payer: 59 | Admitting: Endocrinology

## 2019-09-28 ENCOUNTER — Other Ambulatory Visit: Payer: Self-pay

## 2019-09-28 VITALS — BP 120/70 | HR 80 | Ht 71.0 in | Wt 257.0 lb

## 2019-09-28 DIAGNOSIS — E291 Testicular hypofunction: Secondary | ICD-10-CM | POA: Diagnosis not present

## 2019-09-28 DIAGNOSIS — Z125 Encounter for screening for malignant neoplasm of prostate: Secondary | ICD-10-CM

## 2019-09-28 NOTE — Patient Instructions (Signed)
Blood tests are requested for you today.  We'll let you know about the results.  Testosterone treatment has risks, including increased or decreased fertility (depending on the type of treatment), hair loss, prostate cancer, benign prostate enlargement, blood clots, liver problems, lower hdl ("good cholesterol"), polycythemia (opposite of anemia), sleep apnea, and behavior changes.   Please come back for a follow-up appointment in 1 year.   

## 2019-09-28 NOTE — Progress Notes (Signed)
Subjective:    Patient ID: Kristopher Stout, male    DOB: 09-07-1955, 64 y.o.   MRN: [id number]  HPI Pt returns for f/u of idiopathic central hypogonadism (dx'ed 2015; he has 2 biological children; he says he has never taken illicit androgens; he was first rx'ed androgel, but was successfully changed to clomid).  ED sxs are unchanged.   Past Medical History:  Diagnosis Date  . Headache   . Hypogonadism male   . Insomnia   . Sleep apnea     Past Surgical History:  Procedure Laterality Date  . CHOLECYSTECTOMY  07/21/2017  . ELBOW SURGERY Left 1999  . NASAL SINUS SURGERY  1993  . SHOULDER ARTHROSCOPY WITH ROTATOR CUFF REPAIR AND SUBACROMIAL DECOMPRESSION Right 04/16/2018   Procedure: RIGHT SHOULDER ARTHROSCOPY SUBACROMIAL DECOMPRESSION, MINI OPEN ROTATOR CUFF TEAR REPAIR;  Surgeon: Meredith Pel, MD;  Location: [place];  Service: Orthopedics;  Laterality: Right;    Social History   Socioeconomic History  . Marital status: Married    Spouse name: Not on file  . Number of children: Not on file  . Years of education: Not on file  . Highest education level: Not on file  Occupational History  . Not on file  Tobacco Use  . Smoking status: Never Smoker  . Smokeless tobacco: Never Used  Substance and Sexual Activity  . Alcohol use: Yes    Alcohol/week: 0.0 standard drinks    Comment: 1 beer at night on weekends  . Drug use: Never  . Sexual activity: Not on file  Other Topics Concern  . Not on file  Social History Narrative  . Not on file   Social Determinants of Health   Financial Resource Strain:   . Difficulty of Paying Living Expenses:   Food Insecurity:   . Worried About Charity fundraiser in the Last Year:   . Arboriculturist in the Last Year:   Transportation Needs:   . Film/video editor (Medical):   Marland Kitchen Lack of Transportation (Non-Medical):   Physical Activity:   . Days of Exercise per Week:   . Minutes of Exercise per Session:   Stress:   . Feeling of  Stress :   Social Connections:   . Frequency of Communication with Friends and Family:   . Frequency of Social Gatherings with Friends and Family:   . Attends Religious Services:   . Active Member of Clubs or Organizations:   . Attends Archivist Meetings:   Marland Kitchen Marital Status:   Intimate Partner Violence:   . Fear of Current or Ex-Partner:   . Emotionally Abused:   Marland Kitchen Physically Abused:   . Sexually Abused:     Current Outpatient Medications on File Prior to Visit  Medication Sig Dispense Refill  . clomiPHENE (CLOMID) 50 MG tablet 1/4 tab daily 25 tablet 3  . diclofenac (VOLTAREN) 50 MG EC tablet Take 50 mg by mouth 2 (two) times daily.    . fluticasone (FLONASE) 50 MCG/ACT nasal spray Place 2 sprays into both nostrils daily.     . Glucosamine-Chondroitin (OSTEO BI-FLEX REGULAR STRENGTH PO) Take 1 tablet by mouth 2 (two) times daily.    . meclizine (ANTIVERT) 25 MG tablet Take 25 mg by mouth 3 (three) times daily as needed for dizziness.     . Multiple Vitamins-Minerals (CENTRUM SILVER PO) Take 1 tablet by mouth daily.    . Omega-3 Fatty Acids (FISH OIL PO) Take 1 capsule by mouth  daily.    . promethazine (PHENERGAN) 12.5 MG tablet Take 12.5 mg by mouth every 6 (six) hours as needed for nausea or vomiting.  2  . SUMAtriptan (IMITREX) 20 MG/ACT nasal spray Place 20 mg into the nose every 2 (two) hours as needed for migraine or headache. May repeat in 2 hours if headache persists or recurs.    . tamsulosin (FLOMAX) 0.4 MG CAPS capsule Take 0.4 mg by mouth every evening.    . zolpidem (AMBIEN CR) 12.5 MG CR tablet Take 12.5 mg by mouth at bedtime as needed for sleep.     No current facility-administered medications on file prior to visit.    Allergies  Allergen Reactions  . Metronidazole Diarrhea, Rash and Other (See Comments)    Bloody stools  . Zyrtec [Cetirizine] Palpitations    Family History  Problem Relation Age of Onset  . Other Neg Hx        hypogonadism     BP 120/70   Pulse 80   Ht 5\' 11"  (1.803 m)   Wt 257 lb (116.6 kg)   SpO2 98%   BMI 35.84 kg/m    Review of Systems No change in decreased urinary stream    Objective:   Physical Exam VITAL SIGNS:  See vs page GENERAL: no distress Ext: no leg edema.   Lab Results  Component Value Date   TESTOSTERONE 324 09/28/2019       Assessment & Plan:  Hypogonadism: well-controlled.  Please continue the same medication.    Patient Instructions  Blood tests are requested for you today.  We'll let you know about the results.   Testosterone treatment has risks, including increased or decreased fertility (depending on the type of treatment), hair loss, prostate cancer, benign prostate enlargement, blood clots, liver problems, lower hdl ("good cholesterol"), polycythemia (opposite of anemia), sleep apnea, and behavior changes.   Please come back for a follow-up appointment in 1 year.

## 2019-09-29 LAB — PSA: PSA: 2.17 ng/mL (ref 0.10–4.00)

## 2019-09-29 LAB — TSH: TSH: 1.43 u[IU]/mL (ref 0.35–4.50)

## 2019-09-30 LAB — CBC WITH DIFFERENTIAL/PLATELET
Basophils Absolute: 0 10*3/uL (ref 0.0–0.1)
Basophils Relative: 0.7 % (ref 0.0–3.0)
Eosinophils Absolute: 0.3 10*3/uL (ref 0.0–0.7)
Eosinophils Relative: 4.7 % (ref 0.0–5.0)
HCT: 42.3 % (ref 39.0–52.0)
Hemoglobin: 14.5 g/dL (ref 13.0–17.0)
Lymphocytes Relative: 33.4 % (ref 12.0–46.0)
Lymphs Abs: 2.1 10*3/uL (ref 0.7–4.0)
MCHC: 34.3 g/dL (ref 30.0–36.0)
MCV: 95.6 fl (ref 78.0–100.0)
Monocytes Absolute: 0.5 10*3/uL (ref 0.1–1.0)
Monocytes Relative: 8.5 % (ref 3.0–12.0)
Neutro Abs: 3.3 10*3/uL (ref 1.4–7.7)
Neutrophils Relative %: 52.7 % (ref 43.0–77.0)
Platelets: 191 10*3/uL (ref 150.0–400.0)
RBC: 4.42 Mil/uL (ref 4.22–5.81)
RDW: 13 % (ref 11.5–15.5)
WBC: 6.2 10*3/uL (ref 4.0–10.5)

## 2019-10-01 LAB — TESTOSTERONE,FREE AND TOTAL
Testosterone, Free: 6 pg/mL — ABNORMAL LOW (ref 6.6–18.1)
Testosterone: 324 ng/dL (ref 264–916)

## 2019-10-04 ENCOUNTER — Other Ambulatory Visit: Payer: Self-pay | Admitting: Endocrinology

## 2019-10-04 DIAGNOSIS — E291 Testicular hypofunction: Secondary | ICD-10-CM

## 2019-10-04 MED ORDER — CLOMIPHENE CITRATE 50 MG PO TABS: 25.0000 mg | ORAL_TABLET | Freq: Every day | ORAL | 3 refills | Status: DC

## 2020-04-14 ENCOUNTER — Ambulatory Visit: Payer: Self-pay

## 2020-04-14 ENCOUNTER — Encounter: Payer: Self-pay | Admitting: Orthopaedic Surgery

## 2020-04-14 ENCOUNTER — Ambulatory Visit (INDEPENDENT_AMBULATORY_CARE_PROVIDER_SITE_OTHER): Payer: 59 | Admitting: Orthopaedic Surgery

## 2020-04-14 VITALS — Ht 71.0 in | Wt 255.0 lb

## 2020-04-14 DIAGNOSIS — M5416 Radiculopathy, lumbar region: Secondary | ICD-10-CM | POA: Insufficient documentation

## 2020-04-14 MED ORDER — MELOXICAM 7.5 MG PO TABS: 7.5000 mg | ORAL_TABLET | Freq: Two times a day (BID) | ORAL | 2 refills | Status: AC | PRN

## 2020-04-14 MED ORDER — METHOCARBAMOL 750 MG PO TABS: 750.0000 mg | ORAL_TABLET | Freq: Two times a day (BID) | ORAL | 3 refills | Status: AC | PRN

## 2020-04-14 MED ORDER — PREDNISONE 10 MG (21) PO TBPK: ORAL_TABLET | ORAL | 0 refills | Status: DC

## 2020-04-14 NOTE — Progress Notes (Signed)
Office Visit Note   Patient: Kristopher Stout           Date of Birth: 1955/07/01           MRN: [id number] Visit Date: 04/14/2020              Requested by: Wilburn Mylar, MD No address on file PCP: Wilburn Mylar, MD   Assessment & Plan: Visit Diagnoses:  1. Lumbar radiculopathy, right     Plan: Impression is lumbar radiculopathy.  X-rays and condition reviewed with the patient.  I have sent in a prescription for prednisone Dosepak, Robaxin, meloxicam.  He takes diclofenac at baseline so that he knows not to take both the meloxicam and diclofenac.  Patient will call back if he does not feel any improvement from this.  Follow-Up Instructions: Return if symptoms worsen or fail to improve.   Orders:  Orders Placed This Encounter  Procedures  . XR Lumbar Spine 2-3 Views   Meds ordered this encounter  Medications  . predniSONE (STERAPRED UNI-PAK 21 TAB) 10 MG (21) TBPK tablet    Sig: Take as directed    Dispense:  21 tablet    Refill:  0  . methocarbamol (ROBAXIN) 750 MG tablet    Sig: Take 1 tablet (750 mg total) by mouth 2 (two) times daily as needed for muscle spasms.    Dispense:  20 tablet    Refill:  3  . meloxicam (MOBIC) 7.5 MG tablet    Sig: Take 1 tablet (7.5 mg total) by mouth 2 (two) times daily as needed for pain. Take after you complete prednisone dose pak if needed    Dispense:  30 tablet    Refill:  2      Procedures: No procedures performed   Clinical Data: No additional findings.   Subjective: Chief Complaint  Patient presents with  . Right Leg - Pain    Kristopher Stout is a 64 year old gentleman who is coming in for right leg and back pain with numbness and tingling and weakness for the last 6 days.  Denies any injuries.  He has been using a cane to help with ambulation.  He is unable to find a comfortable position.  Denies any bowel or bladder dysfunction.   Review of Systems  Constitutional: Negative.   All other systems reviewed and are  negative.    Objective: Vital Signs: Ht 5\' 11"  (1.803 m)   Wt 255 lb (115.7 kg)   BMI 35.57 kg/m   Physical Exam Vitals and nursing note reviewed.  Constitutional:      Appearance: He is well-developed.  Pulmonary:     Effort: Pulmonary effort is normal.  Abdominal:     Palpations: Abdomen is soft.  Skin:    General: Skin is warm.  Neurological:     Mental Status: He is alert and oriented to person, place, and time.  Psychiatric:        Behavior: Behavior normal.        Thought Content: Thought content normal.        Judgment: Judgment normal.     Ortho Exam Lumbar spine shows no tenderness.  He has good strength in bilateral lower extremities.  Sensation intact.  Reflexes are symmetric.  He has radiating pain down his right leg from the back.  Positive sciatic tension signs.  Antalgic gait. Specialty Comments:  No specialty comments available.  Imaging: XR Lumbar Spine 2-3 Views  Result Date: 04/14/2020 Mild anterior lumbar  spurring.  Lumbar spondylosis.  Mild to moderate degenerative disc disease    PMFS History: Patient Active Problem List   Diagnosis Date Noted  . Lumbar radiculopathy, right 04/14/2020  . Traumatic complete tear of right rotator cuff   . Other synovitis and tenosynovitis, right shoulder   . Hypogonadism male   . Headache   . Insomnia   . Screening for prostate cancer 09/27/2016  . Hypogonadism, male 05/16/2015   Past Medical History:  Diagnosis Date  . Headache   . Hypogonadism male   . Insomnia   . Sleep apnea     Family History  Problem Relation Age of Onset  . Other Neg Hx        hypogonadism    Past Surgical History:  Procedure Laterality Date  . CHOLECYSTECTOMY  07/21/2017  . ELBOW SURGERY Left 1999  . NASAL SINUS SURGERY  1993  . SHOULDER ARTHROSCOPY WITH ROTATOR CUFF REPAIR AND SUBACROMIAL DECOMPRESSION Right 04/16/2018   Procedure: RIGHT SHOULDER ARTHROSCOPY SUBACROMIAL DECOMPRESSION, MINI OPEN ROTATOR CUFF TEAR  REPAIR;  Surgeon: Cammy Copa, MD;  Location: [facility] OR;  Service: Orthopedics;  Laterality: Right;   Social History   Occupational History  . Not on file  Tobacco Use  . Smoking status: Never Smoker  . Smokeless tobacco: Never Used  Vaping Use  . Vaping Use: Never used  Substance and Sexual Activity  . Alcohol use: Yes    Alcohol/week: 0.0 standard drinks    Comment: 1 beer at night on weekends  . Drug use: Never  . Sexual activity: Not on file

## 2020-04-19 ENCOUNTER — Other Ambulatory Visit: Payer: Self-pay

## 2020-04-19 ENCOUNTER — Telehealth: Payer: Self-pay

## 2020-04-19 DIAGNOSIS — M545 Low back pain, unspecified: Secondary | ICD-10-CM

## 2020-04-19 MED ORDER — PREDNISONE 10 MG (21) PO TBPK: ORAL_TABLET | ORAL | 0 refills | Status: DC

## 2020-04-19 NOTE — Addendum Note (Signed)
Addended by: Mayra Reel on: 04/19/2020 10:53 AM   Modules accepted: Orders

## 2020-04-19 NOTE — Telephone Encounter (Signed)
Patient called he stated he has finished the prednisone and he stated it helped a little. Today he is feeling worse he is wondering what's the next step. He would like a call [phone]

## 2020-04-19 NOTE — Telephone Encounter (Signed)
Pt called and stated understanding  °

## 2020-04-19 NOTE — Telephone Encounter (Signed)
Please advise 

## 2020-04-19 NOTE — Telephone Encounter (Signed)
I recommend doing another course.  I have sent this in.

## 2020-04-23 ENCOUNTER — Ambulatory Visit
Admission: RE | Admit: 2020-04-23 | Discharge: 2020-04-23 | Disposition: A | Discharge status: Home / Self Care | Payer: 59 | Source: Ambulatory Visit | Attending: Orthopaedic Surgery | Admitting: Orthopaedic Surgery

## 2020-04-23 DIAGNOSIS — M545 Low back pain, unspecified: Secondary | ICD-10-CM

## 2020-04-23 NOTE — Progress Notes (Signed)
Can we please get him for [facility] please?  Either [place] or [place] [facility].  Thanks.

## 2020-04-24 ENCOUNTER — Other Ambulatory Visit: Payer: Self-pay

## 2020-04-24 DIAGNOSIS — M545 Low back pain, unspecified: Secondary | ICD-10-CM

## 2020-04-26 ENCOUNTER — Encounter: Payer: Self-pay | Admitting: Orthopaedic Surgery

## 2020-04-26 ENCOUNTER — Ambulatory Visit (INDEPENDENT_AMBULATORY_CARE_PROVIDER_SITE_OTHER): Payer: 59 | Admitting: Orthopaedic Surgery

## 2020-04-26 DIAGNOSIS — M5416 Radiculopathy, lumbar region: Secondary | ICD-10-CM | POA: Diagnosis not present

## 2020-04-26 NOTE — Progress Notes (Signed)
   Office Visit Note   Patient: Kristopher Stout           Date of Birth: 1956/05/26           MRN: [id number] Visit Date: 04/26/2020              Requested by: Adolph Pollack, FNP [id number] [place],  [place] [address] PCP: Adolph Pollack, FNP   Assessment & Plan: Visit Diagnoses:  1. 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  He has somewhat mild weakness and the pain has gotten better with the prednisone Dosepak.  ESI as scheduled with [facility] imaging tomorrow.  He will let us know how he does from this injection.  If he needs additional injections he will let us know.  Follow-Up Instructions: Return if symptoms worsen or fail to improve.   Orders:  No orders of the defined types were placed in this encounter.  No orders of the defined types were placed in this encounter.     Procedures: No procedures performed   Clinical Data: No additional findings.   Subjective: Chief Complaint  Patient presents with  . Lower Back - Pain    Kristopher Stout comes in today for MRI review of the lumbar spine.  He is feeling better and no longer using a cane for ambulation.   Review of Systems   Objective: Vital Signs: There were no vitals taken for this visit.  Physical Exam  Ortho Exam right lower extremity shows better strength and better ambulation with a small limp. Specialty Comments:  No specialty comments available.  Imaging: No results found.   PMFS History: Patient Active Problem List   Diagnosis Date Noted  . Lumbar radiculopathy, right 04/14/2020  . Traumatic complete tear of right rotator cuff   . Other synovitis and tenosynovitis, right shoulder   . Hypogonadism male   . Headache   . Insomnia   . Screening for prostate cancer 09/27/2016  . Hypogonadism, male 05/16/2015   Past Medical History:  Diagnosis Date  . Headache   . Hypogonadism male     . Insomnia   . Sleep apnea     Family History  Problem Relation Age of Onset  . Other Neg Hx        hypogonadism    Past Surgical History:  Procedure Laterality Date  . CHOLECYSTECTOMY  07/21/2017  . ELBOW SURGERY Left 1999  . NASAL SINUS SURGERY  1993  . SHOULDER ARTHROSCOPY WITH ROTATOR CUFF REPAIR AND SUBACROMIAL DECOMPRESSION Right 04/16/2018   Procedure: RIGHT SHOULDER ARTHROSCOPY SUBACROMIAL DECOMPRESSION, MINI OPEN ROTATOR CUFF TEAR REPAIR;  Surgeon: Cammy Copa, MD;  Location: [facility] OR;  Service: Orthopedics;  Laterality: Right;   Social History   Occupational History  . Not on file  Tobacco Use  . Smoking status: Never Smoker  . Smokeless tobacco: Never Used  Vaping Use  . Vaping Use: Never used  Substance and Sexual Activity  . Alcohol use: Yes    Alcohol/week: 0.0 standard drinks    Comment: 1 beer at night on weekends  . Drug use: Never  . Sexual activity: Not on file

## 2020-04-27 ENCOUNTER — Other Ambulatory Visit: Payer: Self-pay

## 2020-04-27 ENCOUNTER — Ambulatory Visit
Admission: RE | Admit: 2020-04-27 | Discharge: 2020-04-27 | Disposition: A | Discharge status: Home / Self Care | Payer: 59 | Source: Ambulatory Visit | Attending: Orthopaedic Surgery | Admitting: Orthopaedic Surgery

## 2020-04-27 DIAGNOSIS — M545 Low back pain, unspecified: Secondary | ICD-10-CM

## 2020-04-27 MED ORDER — METHYLPREDNISOLONE ACETATE 40 MG/ML INJ SUSP (RADIOLOG
120.0000 mg | Freq: Once | INTRAMUSCULAR | Status: AC
  Administered 2020-04-27: 120 mg via EPIDURAL

## 2020-04-27 MED ORDER — IOPAMIDOL (ISOVUE-M 200) INJECTION 41%
1.0000 mL | Freq: Once | INTRAMUSCULAR | Status: AC
  Administered 2020-04-27: 1 mL via EPIDURAL

## 2020-04-27 NOTE — Discharge Instructions (Signed)

## 2020-05-09 ENCOUNTER — Other Ambulatory Visit: Payer: Self-pay

## 2020-05-09 ENCOUNTER — Ambulatory Visit (INDEPENDENT_AMBULATORY_CARE_PROVIDER_SITE_OTHER): Payer: 59 | Admitting: Orthopaedic Surgery

## 2020-05-09 ENCOUNTER — Encounter: Payer: Self-pay | Admitting: Orthopaedic Surgery

## 2020-05-09 DIAGNOSIS — M5416 Radiculopathy, lumbar region: Secondary | ICD-10-CM | POA: Diagnosis not present

## 2020-05-09 NOTE — Progress Notes (Signed)
Office Visit Note   Patient: Kristopher Stout           Date of Birth: 10-24-1955           MRN: [id number] Visit Date: 05/09/2020              Requested by: Adolph Pollack, FNP [address] [place],  [place] [address] PCP: Adolph Pollack, FNP   Assessment & Plan: Visit Diagnoses:  1. 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  We will refer the patient back to [facility] imaging for possible repeat epidural steroid injection.  Should injections fail to relieve his symptoms, will refer him to neurosurgery.  We will also start him in physical therapy in the interim.  Follow-up with [place] as needed.  Follow-Up Instructions: Return if symptoms worsen or fail to improve.   Orders:  No orders of the defined types were placed in this encounter.  No orders of the defined types were placed in this encounter.     Procedures: No procedures performed   Clinical Data: No additional findings.   Subjective: Chief Complaint  Patient presents with  . Lower Back - Pain    HPI patient is a pleasant 64 year old gentleman who comes in today with recurrent right lower extremity radiculopathy.  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  He was referred to [facility] imaging where he had epidural steroid injection on 04/27/2020.  He notes that he had approximately 6 days relief in symptoms.  He thinks he may have overdone things several days after surgery as his pain returned soon after.  He actually notes that his pain has started to improve today.  He is still complaining of constant numbness to his right thigh.  No bowel or bladder dysfunction.     Objective: Vital Signs: There were no vitals taken for this visit.    Ortho Exam stable lumbar exam  Specialty Comments:  No specialty comments available.  Imaging: No new imaging   PMFS  History: Patient Active Problem List   Diagnosis Date Noted  . Lumbar radiculopathy, right 04/14/2020  . Traumatic complete tear of right rotator cuff   . Other synovitis and tenosynovitis, right shoulder   . Hypogonadism male   . Headache   . Insomnia   . Screening for prostate cancer 09/27/2016  . Hypogonadism, male 05/16/2015   Past Medical History:  Diagnosis Date  . Headache   . Hypogonadism male   . Insomnia   . Sleep apnea     Family History  Problem Relation Age of Onset  . Other Neg Hx        hypogonadism    Past Surgical History:  Procedure Laterality Date  . CHOLECYSTECTOMY  07/21/2017  . ELBOW SURGERY Left 1999  . NASAL SINUS SURGERY  1993  . SHOULDER ARTHROSCOPY WITH ROTATOR CUFF REPAIR AND SUBACROMIAL DECOMPRESSION Right 04/16/2018   Procedure: RIGHT SHOULDER ARTHROSCOPY SUBACROMIAL DECOMPRESSION, MINI OPEN ROTATOR CUFF TEAR REPAIR;  Surgeon: Cammy Copa, MD;  Location: [facility] OR;  Service: Orthopedics;  Laterality: Right;   Social History   Occupational History  . Not on file  Tobacco Use  . Smoking status: Never Smoker  . Smokeless tobacco: Never Used  Vaping Use  . Vaping Use: Never used  Substance and Sexual Activity  . Alcohol use: Yes    Alcohol/week: 0.0  standard drinks    Comment: 1 beer at night on weekends  . Drug use: Never  . Sexual activity: Not on file

## 2020-05-10 ENCOUNTER — Other Ambulatory Visit: Payer: Self-pay

## 2020-05-10 DIAGNOSIS — M5416 Radiculopathy, lumbar region: Secondary | ICD-10-CM

## 2020-05-18 ENCOUNTER — Inpatient Hospital Stay
Admission: RE | Admit: 2020-05-18 | Discharge: 2020-05-18 | Disposition: A | Discharge status: Home / Self Care | Payer: 59 | Source: Ambulatory Visit | Attending: Orthopaedic Surgery | Admitting: Orthopaedic Surgery

## 2020-05-18 NOTE — Discharge Instructions (Signed)

## 2020-06-01 ENCOUNTER — Other Ambulatory Visit: Payer: 59

## 2020-09-28 ENCOUNTER — Ambulatory Visit: Payer: 59 | Admitting: Endocrinology

## 2020-10-04 ENCOUNTER — Other Ambulatory Visit: Payer: Self-pay

## 2020-10-04 ENCOUNTER — Ambulatory Visit (INDEPENDENT_AMBULATORY_CARE_PROVIDER_SITE_OTHER): Payer: 59 | Admitting: Endocrinology

## 2020-10-04 ENCOUNTER — Encounter: Payer: Self-pay | Admitting: Endocrinology

## 2020-10-04 VITALS — BP 128/82 | HR 80 | Ht 71.0 in | Wt 259.2 lb

## 2020-10-04 DIAGNOSIS — E291 Testicular hypofunction: Secondary | ICD-10-CM

## 2020-10-04 LAB — CBC WITH DIFFERENTIAL/PLATELET
Basophils Absolute: 0 10*3/uL (ref 0.0–0.1)
Basophils Relative: 0.3 % (ref 0.0–3.0)
Eosinophils Absolute: 0.2 10*3/uL (ref 0.0–0.7)
Eosinophils Relative: 2.5 % (ref 0.0–5.0)
HCT: 42.1 % (ref 39.0–52.0)
Hemoglobin: 14.7 g/dL (ref 13.0–17.0)
Lymphocytes Relative: 21.3 % (ref 12.0–46.0)
Lymphs Abs: 1.8 10*3/uL (ref 0.7–4.0)
MCHC: 35 g/dL (ref 30.0–36.0)
MCV: 92.2 fl (ref 78.0–100.0)
Monocytes Absolute: 0.9 10*3/uL (ref 0.1–1.0)
Monocytes Relative: 10.7 % (ref 3.0–12.0)
Neutro Abs: 5.5 10*3/uL (ref 1.4–7.7)
Neutrophils Relative %: 65.2 % (ref 43.0–77.0)
Platelets: 162 10*3/uL (ref 150.0–400.0)
RBC: 4.57 Mil/uL (ref 4.22–5.81)
RDW: 13.3 % (ref 11.5–15.5)
WBC: 8.4 10*3/uL (ref 4.0–10.5)

## 2020-10-04 LAB — TSH: TSH: 1.34 u[IU]/mL (ref 0.35–4.50)

## 2020-10-04 LAB — PSA: PSA: 2.39 ng/mL (ref 0.10–4.00)

## 2020-10-04 NOTE — Progress Notes (Signed)
Subjective:    Patient ID: Kristopher Stout, male    DOB: 22-Jul-1955, 65 y.o.   MRN: [id number]  HPI Pt returns for f/u of idiopathic central hypogonadism (dx'ed 2015; he has 2 biological children; he says he has never taken illicit androgens; he was first rx'ed androgel, but was successfully changed to clomid).  ED sxs persist.   Past Medical History:  Diagnosis Date  . Headache   . Hypogonadism male   . Insomnia   . Sleep apnea      Social History   Socioeconomic History  . Marital status: Married    Spouse name: Not on file  . Number of children: Not on file  . Years of education: Not on file  . Highest education level: Not on file  Occupational History  . Not on file  Tobacco Use  . Smoking status: Never Smoker  . Smokeless tobacco: Never Used  Vaping Use  . Vaping Use: Never used  Substance and Sexual Activity  . Alcohol use: Yes    Alcohol/week: 0.0 standard drinks    Comment: 1 beer at night on weekends  . Drug use: Never  . Sexual activity: Not on file  Other Topics Concern  . Not on file  Social History Narrative  . Not on file   Social Determinants of Health   Financial Resource Strain: Not on file  Food Insecurity: Not on file  Transportation Needs: Not on file  Physical Activity: Not on file  Stress: Not on file  Social Connections: Not on file  Intimate Partner Violence: Not on file    Current Outpatient Medications on File Prior to Visit  Medication Sig Dispense Refill  . clomiPHENE (CLOMID) 50 MG tablet Take 0.5 tablets (25 mg total) by mouth daily. 45 tablet 3  . diclofenac (VOLTAREN) 50 MG EC tablet Take 50 mg by mouth 2 (two) times daily.    . fluticasone (FLONASE) 50 MCG/ACT nasal spray Place 2 sprays into both nostrils daily.     . Glucosamine-Chondroitin (OSTEO BI-FLEX REGULAR STRENGTH PO) Take 1 tablet by mouth 2 (two) times daily.    . meclizine (ANTIVERT) 25 MG tablet Take 25 mg by mouth 3 (three) times daily as needed for dizziness.      . meloxicam (MOBIC) 7.5 MG tablet Take 1 tablet (7.5 mg total) by mouth 2 (two) times daily as needed for pain. Take after you complete prednisone dose pak if needed 30 tablet 2  . methocarbamol (ROBAXIN) 750 MG tablet Take 1 tablet (750 mg total) by mouth 2 (two) times daily as needed for muscle spasms. 20 tablet 3  . metroNIDAZOLE (METROGEL) 0.75 % gel Apply 1 application topically at bedtime.    . minocycline (DYNACIN) 50 MG tablet Take by mouth at bedtime.    . minocycline (MINOCIN) 100 MG capsule Take 100 mg by mouth 2 (two) times daily.    . Multiple Vitamins-Minerals (CENTRUM SILVER PO) Take 1 tablet by mouth daily.    . Omega-3 Fatty Acids (FISH OIL PO) Take 1 capsule by mouth daily.    . predniSONE (STERAPRED UNI-PAK 21 TAB) 10 MG (21) TBPK tablet Take as directed 21 tablet 0  . promethazine (PHENERGAN) 12.5 MG tablet Take 12.5 mg by mouth every 6 (six) hours as needed for nausea or vomiting.  2  . SUMAtriptan (IMITREX) 20 MG/ACT nasal spray Place 20 mg into the nose every 2 (two) hours as needed for migraine or headache. May repeat in 2 hours  if headache persists or recurs.    . Tadalafil 2.5 MG TABS Take by mouth.    . tamsulosin (FLOMAX) 0.4 MG CAPS capsule Take 0.4 mg by mouth every evening.    . zolpidem (AMBIEN CR) 12.5 MG CR tablet Take 12.5 mg by mouth at bedtime as needed for sleep.     No current facility-administered medications on file prior to visit.    Allergies  Allergen Reactions  . Metronidazole Diarrhea, Rash and Other (See Comments)    Bloody stools  . Moxifloxacin Other (See Comments)    Multiple side effects, pt cannot recall exactly  . Zyrtec [Cetirizine] Palpitations    Family History  Problem Relation Age of Onset  . Other Neg Hx        hypogonadism    BP 128/82 (BP Location: Right Arm, Patient Position: Sitting, Cuff Size: Normal)   Pulse 80   Ht 5\' 11"  (1.803 m)   Wt 259 lb 3.2 oz (117.6 kg)   SpO2 98%   BMI 36.15 kg/m    Review of  Systems Denies decreased urination, breast swelling, and excessive sweating.         Objective:   Physical Exam VITAL SIGNS:  See vs page.   GENERAL: no distress.   EXT: 1+ bilat leg edema.    Lab Results  Component Value Date   TESTOSTERONE 335 10/04/2020   Free testosterone=3.3    Assessment & Plan:  Low testosterone: uncontrolled.  I offered to increase clomid ED: check TFT.  Please continue the same cialis.

## 2020-10-04 NOTE — Patient Instructions (Addendum)
Blood tests are requested for you today.  We'll let you know about the results.   Testosterone treatment has risks, including increased or decreased fertility (depending on the type of treatment), hair loss, prostate cancer, benign prostate enlargement, blood clots, liver problems, lower hdl ("good cholesterol"), polycythemia (opposite of anemia), sleep apnea, and behavior changes.    Weight loss also helps the testosterone.   Please come back for a follow-up appointment in 1 year.

## 2020-10-05 LAB — T4, FREE: Free T4: 0.84 ng/dL (ref 0.60–1.60)

## 2020-10-07 LAB — TESTOSTERONE,FREE AND TOTAL
Testosterone, Free: 3.3 pg/mL — ABNORMAL LOW (ref 6.6–18.1)
Testosterone: 335 ng/dL (ref 264–916)

## 2020-10-25 ENCOUNTER — Other Ambulatory Visit: Payer: Self-pay | Admitting: Endocrinology

## 2020-10-25 ENCOUNTER — Encounter: Payer: Self-pay | Admitting: Endocrinology

## 2021-05-26 IMAGING — XA Imaging study
2 series · 2 of 2 positions shown · non-contrast
Comparison: none

CLINICAL DATA: Spondylosis without myelopathy. Right low back pain
with right leg pain and numbness.

[Series 1: ortho adipose · 1 of 1 slices shown (1 of 2)]
[im 1/1]
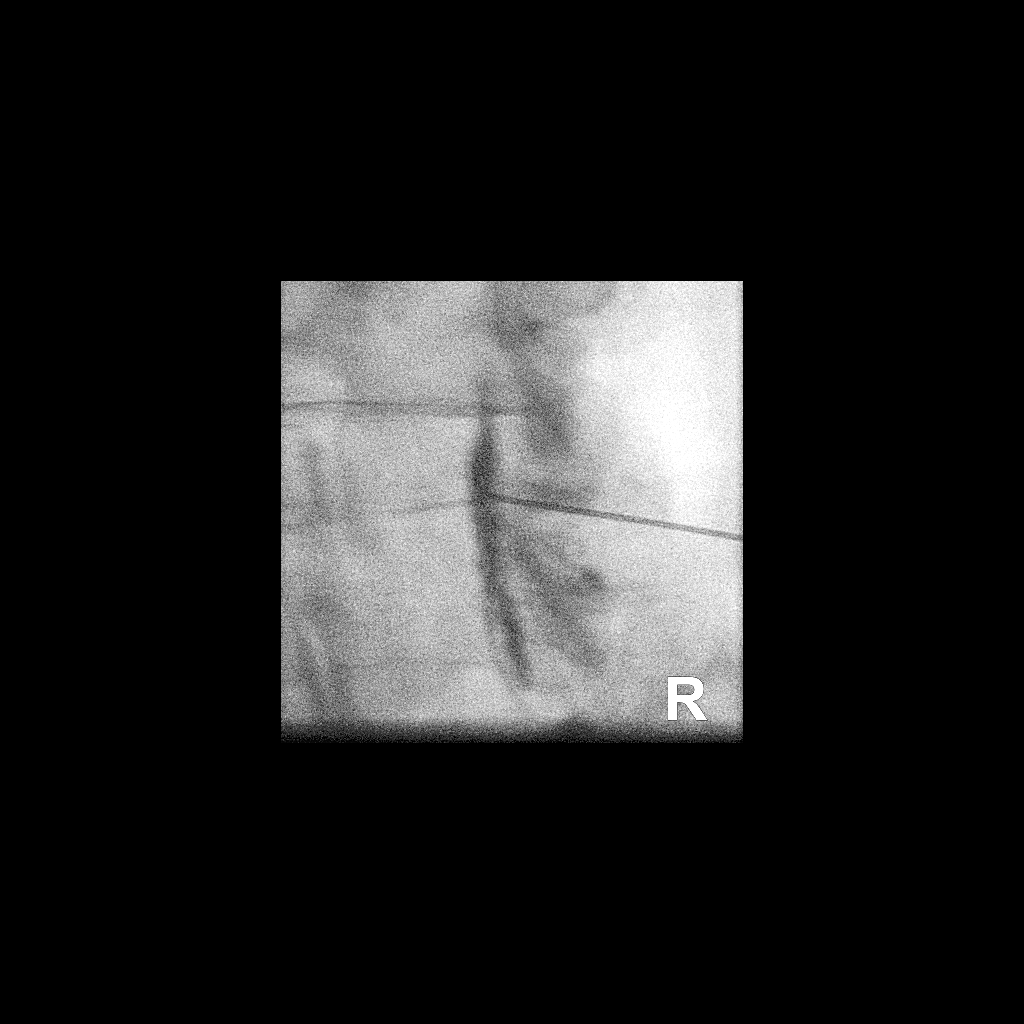

[Series 2: ortho adipose · 1 of 1 slices shown (2 of 2)]
[im 1/1]
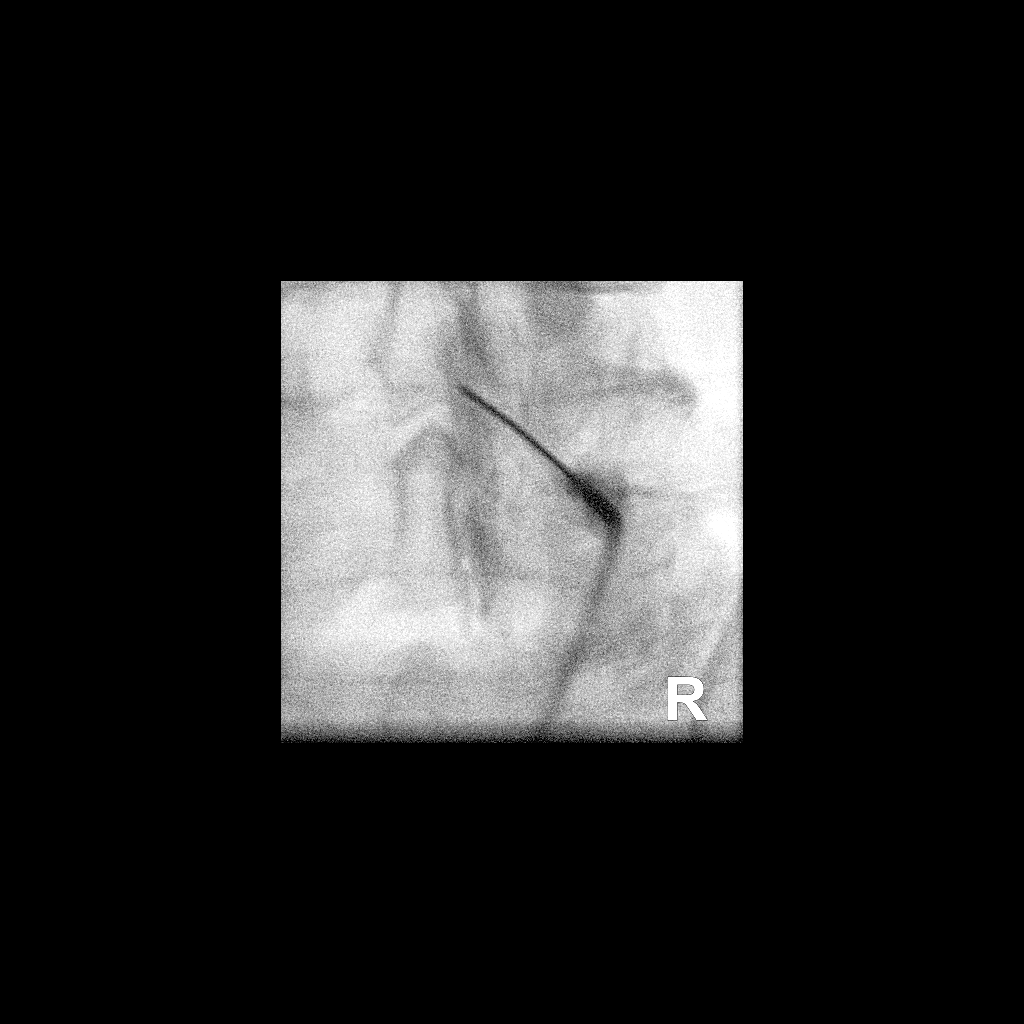

[2 of 2 positions shown; findings below may reference images not displayed]

FLUOROSCOPY TIME:  0 minutes 37 seconds. 25.55 micro gray meter
squared

PROCEDURE:
The procedure, risks, benefits, and alternatives were explained to
the patient. Questions regarding the procedure were encouraged and
answered. The patient understands and consents to the procedure.

LUMBAR EPIDURAL INJECTION:

An interlaminar approach was performed on right at L4-5. The
overlying skin was cleansed and anesthetized. A 20 gauge epidural
needle was advanced using loss-of-resistance technique.

DIAGNOSTIC EPIDURAL INJECTION:

Injection of Isovue-M 200 shows a good epidural pattern with spread
above and below the level of needle placement, primarily on the
right. No vascular opacification is seen.

THERAPEUTIC EPIDURAL INJECTION:

One hundred twenty mg of Depo-Medrol mixed with 2 cc 1% lidocaine
were instilled. The procedure was well-tolerated, and the patient
was discharged thirty minutes following the injection in good
condition.

COMPLICATIONS:
None
IMPRESSION: Technically successful epidural injection on the right at L4-5 # 1.

## 2021-10-09 ENCOUNTER — Ambulatory Visit: Payer: 59 | Admitting: Endocrinology

## 2021-10-17 ENCOUNTER — Other Ambulatory Visit: Payer: Self-pay | Admitting: Endocrinology

## 2021-11-14 ENCOUNTER — Other Ambulatory Visit: Payer: Self-pay | Admitting: Internal Medicine

## 2022-10-24 ENCOUNTER — Other Ambulatory Visit (INDEPENDENT_AMBULATORY_CARE_PROVIDER_SITE_OTHER): Payer: 59

## 2022-10-24 ENCOUNTER — Ambulatory Visit (INDEPENDENT_AMBULATORY_CARE_PROVIDER_SITE_OTHER): Payer: 59 | Admitting: Orthopaedic Surgery

## 2022-10-24 ENCOUNTER — Encounter: Payer: Self-pay | Admitting: Orthopaedic Surgery

## 2022-10-24 DIAGNOSIS — M25551 Pain in right hip: Secondary | ICD-10-CM

## 2022-10-24 MED ORDER — PREDNISONE 10 MG (21) PO TBPK: ORAL_TABLET | ORAL | 3 refills | Status: AC

## 2022-10-24 NOTE — Progress Notes (Signed)
Office Visit Note   Patient: Kristopher Stout           Date of Birth: Sep 13, 1955           MRN: [id number] Visit Date: 10/24/2022              Requested by: Vivien Presto, MD [phone] [place],  [place] [address] PCP: Vivien Presto, MD   Assessment & Plan: Visit Diagnoses:  1. Pain in right hip     Plan: Impression 67 year old gentleman with occasional right hip pain.  It is unclear as to whether it is coming from his back or the lateral hip.  For now will send to physical therapy and try steroid Dosepak.  He will follow-up if symptoms persist.  Follow-Up Instructions: No follow-ups on file.   Orders:  Orders Placed This Encounter  Procedures   XR HIP UNILAT W OR W/O PELVIS 2-3 VIEWS RIGHT   No orders of the defined types were placed in this encounter.     Procedures: No procedures performed   Clinical Data: No additional findings.   Subjective: Chief Complaint  Patient presents with   Right Hip - Pain    HPI Kristopher Stout is a very pleasant 67 year old gentleman who comes in for evaluation of right hip pain for a year.  Pain is mainly lateral without any groin pain.  Denies any radicular symptoms.  Worse with driving or prolonged sitting.  States that today he feels okay. Review of Systems  Constitutional: Negative.   HENT: Negative.    Eyes: Negative.   Respiratory: Negative.    Cardiovascular: Negative.   Gastrointestinal: Negative.   Endocrine: Negative.   Genitourinary: Negative.   Skin: Negative.   Allergic/Immunologic: Negative.   Neurological: Negative.   Hematological: Negative.   Psychiatric/Behavioral: Negative.    All other systems reviewed and are negative.    Objective: Vital Signs: There were no vitals taken for this visit.  Physical Exam Vitals and nursing note reviewed.  Constitutional:      Appearance: He is well-developed.  HENT:     Head: Normocephalic and atraumatic.  Eyes:     Pupils: Pupils are equal, round, and  reactive to light.  Pulmonary:     Effort: Pulmonary effort is normal.  Abdominal:     Palpations: Abdomen is soft.  Musculoskeletal:        General: Normal range of motion.     Cervical back: Neck supple.  Skin:    General: Skin is warm.  Neurological:     Mental Status: He is alert and oriented to person, place, and time.  Psychiatric:        Behavior: Behavior normal.        Thought Content: Thought content normal.        Judgment: Judgment normal.     Ortho Exam Examination of the right hip shows good range of motion.  He does not have any significant tenderness to the trochanteric region.  Negative straight leg raise. Specialty Comments:  No specialty comments available.  Imaging: No results found.   PMFS History: Patient Active Problem List   Diagnosis Date Noted   Lumbar radiculopathy, right 04/14/2020   Traumatic complete tear of right rotator cuff    Other synovitis and tenosynovitis, right shoulder    Hypogonadism male    Headache    Insomnia    Screening for prostate cancer 09/27/2016   Hypogonadism, male 05/16/2015   Past Medical History:  Diagnosis Date   Headache    Hypogonadism male    Insomnia    Sleep apnea     Family History  Problem Relation Age of Onset   Other Neg Hx        hypogonadism    Past Surgical History:  Procedure Laterality Date   CHOLECYSTECTOMY  07/21/2017   ELBOW SURGERY Left 1999   NASAL SINUS SURGERY  1993   SHOULDER ARTHROSCOPY WITH ROTATOR CUFF REPAIR AND SUBACROMIAL DECOMPRESSION Right 04/16/2018   Procedure: RIGHT SHOULDER ARTHROSCOPY SUBACROMIAL DECOMPRESSION, MINI OPEN ROTATOR CUFF TEAR REPAIR;  Surgeon: Cammy Copa, MD;  Location: [facility] OR;  Service: Orthopedics;  Laterality: Right;   Social History   Occupational History   Not on file  Tobacco Use   Smoking status: Never   Smokeless tobacco: Never  Vaping Use   Vaping Use: Never used  Substance and Sexual Activity   Alcohol use: Yes    Alcohol/week:  0.0 standard drinks of alcohol    Comment: 1 beer at night on weekends   Drug use: Never   Sexual activity: Not on file

## 2022-11-11 ENCOUNTER — Ambulatory Visit (INDEPENDENT_AMBULATORY_CARE_PROVIDER_SITE_OTHER): Payer: 59 | Admitting: Rehabilitative and Restorative Service Providers"

## 2022-11-11 ENCOUNTER — Encounter: Payer: Self-pay | Admitting: Rehabilitative and Restorative Service Providers"

## 2022-11-11 DIAGNOSIS — R262 Difficulty in walking, not elsewhere classified: Secondary | ICD-10-CM | POA: Diagnosis not present

## 2022-11-11 DIAGNOSIS — M25551 Pain in right hip: Secondary | ICD-10-CM | POA: Diagnosis not present

## 2022-11-11 DIAGNOSIS — M25651 Stiffness of right hip, not elsewhere classified: Secondary | ICD-10-CM | POA: Diagnosis not present

## 2022-11-11 DIAGNOSIS — M6281 Muscle weakness (generalized): Secondary | ICD-10-CM | POA: Diagnosis not present

## 2022-11-11 NOTE — Therapy (Signed)
OUTPATIENT PHYSICAL THERAPY LOWER EXTREMITY EVALUATION   Patient Name: Kristopher Stout MRN: [id number] DOB:Jan 25, 1956, 67 y.o., male Today's Date: 11/11/2022  END OF SESSION:  PT End of Session - 11/11/22 1655     Visit Number 1    Number of Visits 12    Date for PT Re-Evaluation 01/06/23    Authorization - Number of Visits 60    Progress Note Due on Visit 12    PT Start Time 1611    PT Stop Time 1650    PT Time Calculation (min) 39 min    Activity Tolerance Patient tolerated treatment well;Patient limited by pain;No increased pain    Behavior During Therapy WFL for tasks assessed/performed             Past Medical History:  Diagnosis Date   Headache    Hypogonadism male    Insomnia    Sleep apnea    Past Surgical History:  Procedure Laterality Date   CHOLECYSTECTOMY  07/21/2017   ELBOW SURGERY Left 1999   NASAL SINUS SURGERY  1993   SHOULDER ARTHROSCOPY WITH ROTATOR CUFF REPAIR AND SUBACROMIAL DECOMPRESSION Right 04/16/2018   Procedure: RIGHT SHOULDER ARTHROSCOPY SUBACROMIAL DECOMPRESSION, MINI OPEN ROTATOR CUFF TEAR REPAIR;  Surgeon: Cammy Copa, MD;  Location: [facility] OR;  Service: Orthopedics;  Laterality: Right;   Patient Active Problem List   Diagnosis Date Noted   Lumbar radiculopathy, right 04/14/2020   Traumatic complete tear of right rotator cuff    Other synovitis and tenosynovitis, right shoulder    Hypogonadism male    Headache    Insomnia    Screening for prostate cancer 09/27/2016   Hypogonadism, male 05/16/2015    PCP: Vivien Presto, MD  REFERRING PROVIDER: Tarry Kos, MD  REFERRING DIAG:  Diagnosis  M25.551 (ICD-10-CM) - Pain in right hip    THERAPY DIAG:  Difficulty in walking, not elsewhere classified - Plan: PT plan of care cert/re-cert  Muscle weakness (generalized) - Plan: PT plan of care cert/re-cert  Stiffness of right hip, not elsewhere classified - Plan: PT plan of care cert/re-cert  Pain in right hip - Plan: PT  plan of care cert/re-cert  Rationale for Evaluation and Treatment: Rehabilitation  ONSET DATE: Chronic, about a year in duration  SUBJECTIVE:   SUBJECTIVE STATEMENT: [name] notes in January 2022 he had a toe fusion.  He feels this may have contributed to his right hip pain which started bothering him last year.  He notes some right lateral thigh numbness.  He got relief with oral steroids.    He has not had an injection yet.  PERTINENT HISTORY: Headache, Rt rotator cuff tear, Rt lumbar radiculopathy  PAIN:  Are you having pain? Yes: NPRS scale: 0-5/10 over the past week on a 10/10 Pain location: Right hip and gluteal Pain description: Sharp in the groin and "broomstick to the rear" pain with gluteal Aggravating factors: Sit to stand, prolonged driving, prolonged standing Relieving factors: Better with walking, steroids  PRECAUTIONS: None  WEIGHT BEARING RESTRICTIONS: No  FALLS:  Has patient fallen in last 6 months? 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  No acute abnormalities.  PATIENT SURVEYS:  FOTO Next visit (11 minutes late to evaluation)  COGNITION: Overall cognitive status: Within functional limits for tasks assessed     SENSATION: Right lateral thigh numbness  MUSCLE LENGTH: Hamstrings: Right 30 deg; Left 35 deg  POSTURE: rounded shoulders, forward head, and decreased lumbar lordosis  LOWER EXTREMITY ROM:  Passive ROM Left/Right 11/11/2022   Hip flexion 80/80   Hip extension    Hip abduction    Hip adduction    Hip  internal rotation 0/-5   Hip external rotation 39/40   Knee flexion    Knee extension    Ankle dorsiflexion    Ankle plantarflexion    Ankle inversion    Ankle eversion     (Blank rows = not tested)  LOWER EXTREMITY STRENGTH:  MMT out of 5 Left/Right 11/11/2022   Hip flexion 5/5-   Hip extension    Hip abduction 4/4   Hip adduction    Hip internal rotation    Hip external rotation    Knee flexion    Knee extension 5/4   Ankle dorsiflexion    Ankle plantarflexion    Ankle inversion    Ankle eversion     (Blank rows = not tested)  GAIT: Distance walked: 100 feet Assistive device utilized: None Level of assistance: Complete Independence Comments: The only significant deviations noted was right hip external rotation.  Bilateral foot overpronation was also noted.   TODAY'S TREATMENT:                                                                                                                              DATE: 11/11/2022  Single knee to chest stretch with other leg straight 2 x 20 seconds each Hamstrings stretch supine with other leg straight 2 x 20 seconds Gluteal stretch (knee to opposite shoulder with other leg straight) 2 x 20 seconds  PATIENT EDUCATION:  Education details: Reviewed exam findings and home exercise program Person educated: Patient Education method: Explanation, Demonstration, Tactile cues, Verbal cues, and Handouts Education comprehension: verbalized understanding, returned demonstration, verbal cues required, tactile cues required, and needs further education  HOME EXERCISE PROGRAM: [id number]  ASSESSMENT:  CLINICAL IMPRESSION: Patient is a 67 y.o. male who was seen today for physical therapy evaluation and treatment for  Diagnosis  M25.551 (ICD-10-CM) - Pain in right hip  .  [name] has a greater than 1 year history of right groin and gluteal pain.  He also notes some right lateral thigh paresthesias.  [name] has significant hip tightness bilaterally and  this was the focus of today's intervention.  I will complete a spine screen and add some hip abductor strength work to address minor strength impairments noted of the right hip.  His prognosis is good to meet the below listed goals with supervised physical therapy and adequate home program participation.  OBJECTIVE  IMPAIRMENTS: Abnormal gait, decreased activity tolerance, decreased endurance, decreased knowledge of condition, decreased ROM, decreased strength, impaired perceived functional ability, impaired flexibility, postural dysfunction, obesity, and pain.   ACTIVITY LIMITATIONS: bending, sitting, standing, sleeping, stairs, and caring for others  PARTICIPATION LIMITATIONS: interpersonal relationship, driving, community activity, and occupation  PERSONAL FACTORS: Headache, Rt rotator cuff tear, Rt lumbar radiculopathy are also affecting patient's functional outcome.   REHAB POTENTIAL: Good  CLINICAL DECISION MAKING: Stable/uncomplicated  EVALUATION COMPLEXITY: Low   GOALS: Goals reviewed with patient? Yes  SHORT TERM GOALS: Target date: 12/09/2022 Improve bilateral lower extremity for hip flexors to 90 degrees, hamstrings to 45 degrees and hip internal rotation to 5 degrees Baseline: 80 degrees, 35/30 degrees and 0/-5 degrees respectively Goal status: INITIAL  2.  [name] will be independent with his day 1 home exercise program Baseline: Started 11/11/2022 Goal status: INITIAL   LONG TERM GOALS: Target date: 01/06/2023  Improve FOTO to goal established at initial FOTO Baseline: Deferred to visit 2 secondary to arriving 11 minutes late to his 45-minute appointment Goal status: INITIAL  2.  [name] will report right hip and groin pain consistently less than 3 out of 10 on the numeric pain rating scale Baseline: Can be as high as 5 out of 10 Goal status: INITIAL  3.  Improve bilateral hip abductors strength to 5 out of 5 MMT Baseline: 5-/5 Goal status: INITIAL  4.  [name] will be  independent with his long-term maintenance home exercise program Baseline: Started 11/11/2022 Goal status: INITIAL  5.  [name] will report improved sitting and standing endurance as compared to evaluation Baseline: Limited sitting and standing endurance 11/11/2022 Goal status: INITIAL   PLAN:  PT FREQUENCY: 1-2x/week  PT DURATION: 8 weeks  PLANNED INTERVENTIONS: Therapeutic exercises, Therapeutic activity, Neuromuscular re-education, Balance training, Gait training, Patient/Family education, Self Care, Joint mobilization, Stair training, Dry Needling, Cryotherapy, and Manual therapy  PLAN FOR NEXT SESSION: Review day 1 home exercise program.  Do a brief spine screen with emphasis on standing trunk extension measurement.  Progress right hip abductor strength as appropriate although emphasis should remain on flexibility.  At an appropriate time, progress into stairs as [name] notes particular difficulty with ascending stairs.   Cherlyn Cushing, PT, MPT 11/11/2022, 5:11 PM

## 2022-11-14 ENCOUNTER — Encounter: Payer: Self-pay | Admitting: Rehabilitative and Restorative Service Providers"

## 2022-11-14 ENCOUNTER — Ambulatory Visit (INDEPENDENT_AMBULATORY_CARE_PROVIDER_SITE_OTHER): Payer: 59 | Admitting: Rehabilitative and Restorative Service Providers"

## 2022-11-14 DIAGNOSIS — M25651 Stiffness of right hip, not elsewhere classified: Secondary | ICD-10-CM

## 2022-11-14 DIAGNOSIS — M25551 Pain in right hip: Secondary | ICD-10-CM | POA: Diagnosis not present

## 2022-11-14 DIAGNOSIS — M6281 Muscle weakness (generalized): Secondary | ICD-10-CM | POA: Diagnosis not present

## 2022-11-14 DIAGNOSIS — R262 Difficulty in walking, not elsewhere classified: Secondary | ICD-10-CM

## 2022-11-14 NOTE — Therapy (Signed)
OUTPATIENT PHYSICAL THERAPY LOWER EXTREMITY TREATMENT   Patient Name: KAIYU OSEN MRN: [id number] DOB:10/07/1955, 67 y.o., male Today's Date: 11/14/2022  END OF SESSION:  PT End of Session - 11/14/22 0803     Visit Number 2    Number of Visits 12    Date for PT Re-Evaluation 01/06/23    Authorization - Number of Visits 60    Progress Note Due on Visit 12    PT Start Time 0801    PT Stop Time 0843    PT Time Calculation (min) 42 min    Activity Tolerance Patient tolerated treatment well;No increased pain    Behavior During Therapy WFL for tasks assessed/performed              Past Medical History:  Diagnosis Date   Headache    Hypogonadism male    Insomnia    Sleep apnea    Past Surgical History:  Procedure Laterality Date   CHOLECYSTECTOMY  07/21/2017   ELBOW SURGERY Left 1999   NASAL SINUS SURGERY  1993   SHOULDER ARTHROSCOPY WITH ROTATOR CUFF REPAIR AND SUBACROMIAL DECOMPRESSION Right 04/16/2018   Procedure: RIGHT SHOULDER ARTHROSCOPY SUBACROMIAL DECOMPRESSION, MINI OPEN ROTATOR CUFF TEAR REPAIR;  Surgeon: Cammy Copa, MD;  Location: [facility] OR;  Service: Orthopedics;  Laterality: Right;   Patient Active Problem List   Diagnosis Date Noted   Lumbar radiculopathy, right 04/14/2020   Traumatic complete tear of right rotator cuff    Other synovitis and tenosynovitis, right shoulder    Hypogonadism male    Headache    Insomnia    Screening for prostate cancer 09/27/2016   Hypogonadism, male 05/16/2015    PCP: Vivien Presto, MD  REFERRING PROVIDER: Tarry Kos, MD  REFERRING DIAG:  Diagnosis  M25.551 (ICD-10-CM) - Pain in right hip    THERAPY DIAG:  Difficulty in walking, not elsewhere classified  Muscle weakness (generalized)  Stiffness of right hip, not elsewhere classified  Pain in right hip  Rationale for Evaluation and Treatment: Rehabilitation  ONSET DATE: Chronic, about a year in duration  SUBJECTIVE:   SUBJECTIVE  STATEMENT: [name] notes early symptomatic relief post 3 days of HEP compliance.    In January 2022 he had a toe fusion.  He feels this may have contributed to his right hip pain which started bothering him last year.  He notes some right lateral thigh numbness.  He got relief with oral steroids.    He has not had an injection yet.  PERTINENT HISTORY: Headache, Rt rotator cuff tear, Rt lumbar radiculopathy  PAIN:  Are you having pain? Yes: NPRS scale: 0-4/10 over the past 3 days on a 10/10 Pain location: Right hip and gluteal Pain description: Sharp in the groin and "broomstick to the rear" pain with gluteal Aggravating factors: Sit to stand, prolonged driving, prolonged standing Relieving factors: Better with walking, steroids  PRECAUTIONS: None  WEIGHT BEARING RESTRICTIONS: No  FALLS:  Has patient fallen in last 6 months? 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  No acute abnormalities.  PATIENT SURVEYS:   FOTO Next visit (11 minutes late to evaluation)  COGNITION: Overall cognitive status: Within functional limits for tasks assessed     SENSATION: Right lateral thigh numbness  MUSCLE LENGTH: Hamstrings: Right 30 deg; Left 35 deg  POSTURE: rounded shoulders, forward head, and decreased lumbar lordosis  LOWER EXTREMITY ROM:  Passive ROM Left/Right 11/11/2022 11/14/2022  Hip flexion 80/80   Hip extension    Hip abduction    Hip adduction    Hip internal rotation 0/-5   Hip external rotation 39/40   Knee flexion    Knee extension     Ankle dorsiflexion    Ankle plantarflexion    Ankle inversion    Ankle eversion    Lumbar extension AROM  10   (Blank rows = not tested)  LOWER EXTREMITY STRENGTH:  MMT out of 5 Left/Right 11/11/2022   Hip flexion 5/5-   Hip extension    Hip abduction 4/4   Hip adduction    Hip internal rotation    Hip external rotation    Knee flexion    Knee extension 5/4   Ankle dorsiflexion    Ankle plantarflexion    Ankle inversion    Ankle eversion     (Blank rows = not tested)  GAIT: Distance walked: 100 feet Assistive device utilized: None Level of assistance: Complete Independence Comments: The only significant deviations noted was right hip external rotation.  Bilateral foot overpronation was also noted.   TODAY'S TREATMENT:                                                                                                                              DATE:  11/14/2022 Single knee to chest stretch with other leg straight 5 x 20 seconds each Hamstrings stretch supine with other leg straight 5 x 20 seconds Gluteal stretch (knee to opposite shoulder with other leg straight) 5 x 20 seconds Bridging 10 x 5 seconds Trunk extension AROM 10 x 3 seconds Yoga Bridge 10 x 5 seconds   11/11/2022  Single knee to chest stretch with other leg straight 2 x 20 seconds each Hamstrings stretch supine with other leg straight 2 x 20 seconds Gluteal stretch (knee to opposite shoulder with other leg straight) 2 x 20 seconds  PATIENT EDUCATION:  Education details: Reviewed exam findings and home exercise program Person educated: Patient Education method: Explanation, Demonstration, Tactile cues, Verbal cues, and Handouts Education comprehension: verbalized understanding, returned demonstration, verbal cues required, tactile cues required, and needs further education  HOME EXERCISE PROGRAM: [id number]  ASSESSMENT:  CLINICAL IMPRESSION: [name] reports symptomatic progress with just a few days of his  home exercise program.  We corrected his day 1 home exercise program and added additional activities to address AROM, flexibility and strength impairments noted at evaluation.  [name]'s prognosis to meet goals established at evaluation is good with the recommended plan of care.  OBJECTIVE IMPAIRMENTS: Abnormal gait, decreased activity tolerance, decreased  endurance, decreased knowledge of condition, decreased ROM, decreased strength, impaired perceived functional ability, impaired flexibility, postural dysfunction, obesity, and pain.   ACTIVITY LIMITATIONS: bending, sitting, standing, sleeping, stairs, and caring for others  PARTICIPATION LIMITATIONS: interpersonal relationship, driving, community activity, and occupation  PERSONAL FACTORS: Headache, Rt rotator cuff tear, Rt lumbar radiculopathy are also affecting patient's functional outcome.   REHAB POTENTIAL: Good  CLINICAL DECISION MAKING: Stable/uncomplicated  EVALUATION COMPLEXITY: Low   GOALS: Goals reviewed with patient? Yes  SHORT TERM GOALS: Target date: 12/09/2022 Improve bilateral lower extremity for hip flexors to 90 degrees, hamstrings to 45 degrees and hip internal rotation to 5 degrees Baseline: 80 degrees, 35/30 degrees and 0/-5 degrees respectively Goal status: INITIAL  2.  [name] will be independent with his day 1 home exercise program Baseline: Started 11/11/2022 Goal status: On Going 11/14/2022   LONG TERM GOALS: Target date: 01/06/2023  Improve FOTO to goal established at initial FOTO Baseline: Deferred to visit 2 secondary to arriving 11 minutes late to his 45-minute appointment Goal status: INITIAL  2.  [name] will report right hip and groin pain consistently less than 3 out of 10 on the numeric pain rating scale Baseline: Can be as high as 5 out of 10 Goal status: On Going 11/14/2022  3.  Improve bilateral hip abductors strength to 5 out of 5 MMT Baseline: 5-/5 Goal status: INITIAL  4.  [name] will be independent with  his long-term maintenance home exercise program Baseline: Started 11/11/2022 Goal status: INITIAL  5.  [name] will report improved sitting and standing endurance as compared to evaluation Baseline: Limited sitting and standing endurance 11/11/2022 Goal status: INITIAL   PLAN:  PT FREQUENCY: 1-2x/week  PT DURATION: 8 weeks  PLANNED INTERVENTIONS: Therapeutic exercises, Therapeutic activity, Neuromuscular re-education, Balance training, Gait training, Patient/Family education, Self Care, Joint mobilization, Stair training, Dry Needling, Cryotherapy, and Manual therapy  PLAN FOR NEXT SESSION: Review home exercise program.  Progress right hip abductor strength as appropriate although emphasis should remain on flexibility.  At an appropriate time, progress into stairs as [name] notes particular difficulty with ascending stairs.  FOTO!   Cherlyn Cushing, PT, MPT 11/14/2022, 1:12 PM

## 2022-11-22 ENCOUNTER — Encounter: Payer: Self-pay | Admitting: Rehabilitative and Restorative Service Providers"

## 2022-11-22 ENCOUNTER — Ambulatory Visit (INDEPENDENT_AMBULATORY_CARE_PROVIDER_SITE_OTHER): Payer: 59 | Admitting: Rehabilitative and Restorative Service Providers"

## 2022-11-22 DIAGNOSIS — M25651 Stiffness of right hip, not elsewhere classified: Secondary | ICD-10-CM | POA: Diagnosis not present

## 2022-11-22 DIAGNOSIS — M25551 Pain in right hip: Secondary | ICD-10-CM

## 2022-11-22 DIAGNOSIS — R262 Difficulty in walking, not elsewhere classified: Secondary | ICD-10-CM | POA: Diagnosis not present

## 2022-11-22 DIAGNOSIS — M6281 Muscle weakness (generalized): Secondary | ICD-10-CM | POA: Diagnosis not present

## 2022-11-22 NOTE — Therapy (Signed)
OUTPATIENT PHYSICAL THERAPY LOWER EXTREMITY TREATMENT   Patient Name: SAVON VONBARGEN MRN: [id number] DOB:01-May-1956, 67 y.o., male Today's Date: 11/22/2022  END OF SESSION:  PT End of Session - 11/22/22 1608     Visit Number 3    Number of Visits 12    Date for PT Re-Evaluation 01/06/23    Authorization - Number of Visits 60    Progress Note Due on Visit 12    PT Start Time 1515    PT Stop Time 1555    PT Time Calculation (min) 40 min    Activity Tolerance Patient tolerated treatment well;No increased pain    Behavior During Therapy WFL for tasks assessed/performed               Past Medical History:  Diagnosis Date   Headache    Hypogonadism male    Insomnia    Sleep apnea    Past Surgical History:  Procedure Laterality Date   CHOLECYSTECTOMY  07/21/2017   ELBOW SURGERY Left 1999   NASAL SINUS SURGERY  1993   SHOULDER ARTHROSCOPY WITH ROTATOR CUFF REPAIR AND SUBACROMIAL DECOMPRESSION Right 04/16/2018   Procedure: RIGHT SHOULDER ARTHROSCOPY SUBACROMIAL DECOMPRESSION, MINI OPEN ROTATOR CUFF TEAR REPAIR;  Surgeon: Cammy Copa, MD;  Location: [facility] OR;  Service: Orthopedics;  Laterality: Right;   Patient Active Problem List   Diagnosis Date Noted   Lumbar radiculopathy, right 04/14/2020   Traumatic complete tear of right rotator cuff    Other synovitis and tenosynovitis, right shoulder    Hypogonadism male    Headache    Insomnia    Screening for prostate cancer 09/27/2016   Hypogonadism, male 05/16/2015    PCP: Vivien Presto, MD  REFERRING PROVIDER: Tarry Kos, MD  REFERRING DIAG:  Diagnosis  M25.551 (ICD-10-CM) - Pain in right hip    THERAPY DIAG:  Difficulty in walking, not elsewhere classified  Muscle weakness (generalized)  Stiffness of right hip, not elsewhere classified  Pain in right hip  Rationale for Evaluation and Treatment: Rehabilitation  ONSET DATE: Chronic, about a year in duration  SUBJECTIVE:   SUBJECTIVE  STATEMENT: [name] notes continued symptomatic relief with his early physical therapy.  HEP compliance continues to be good.  In January 2022 he had a toe fusion.  He feels this may have contributed to his right hip pain which started bothering him last year.  He notes some right lateral thigh numbness.  He got relief with oral steroids.    He has not had an injection yet.  PERTINENT HISTORY: Headache, Rt rotator cuff tear, Rt lumbar radiculopathy  PAIN:  Are you having pain? Yes: NPRS scale: 1-5/10 over the past 7 days on a 10/10 Pain location: Right hip and gluteal Pain description: Sharp in the groin and "broomstick to the rear" pain with gluteal Aggravating factors: Sit to stand, prolonged driving, prolonged standing Relieving factors: Better with walking, steroids  PRECAUTIONS: None  WEIGHT BEARING RESTRICTIONS: No  FALLS:  Has patient fallen in last 6 months? 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  No acute abnormalities.  PATIENT SURVEYS:   FOTO Next visit (11 minutes late to evaluation)  COGNITION: Overall cognitive status: Within functional limits for tasks assessed     SENSATION: Right lateral thigh numbness  MUSCLE LENGTH: Hamstrings: Right 30 deg; Left 35 deg  POSTURE: rounded shoulders, forward head, and decreased lumbar lordosis  LOWER EXTREMITY ROM:  Passive ROM Left/Right 11/11/2022 11/14/2022  Hip flexion 80/80   Hip extension    Hip abduction    Hip adduction    Hip internal rotation 0/-5   Hip external rotation 39/40    Knee flexion    Knee extension    Ankle dorsiflexion    Ankle plantarflexion    Ankle inversion    Ankle eversion    Lumbar extension AROM  10   (Blank rows = not tested)  LOWER EXTREMITY STRENGTH:  MMT out of 5 Left/Right 11/11/2022   Hip flexion 5/5-   Hip extension    Hip abduction 4/4   Hip adduction    Hip internal rotation    Hip external rotation    Knee flexion    Knee extension 5/4   Ankle dorsiflexion    Ankle plantarflexion    Ankle inversion    Ankle eversion     (Blank rows = not tested)  GAIT: Distance walked: 100 feet Assistive device utilized: None Level of assistance: Complete Independence Comments: The only significant deviations noted was right hip external rotation.  Bilateral foot overpronation was also noted.   TODAY'S TREATMENT:                                                                                                                              DATE:  11/22/2022 Single knee to chest stretch with other leg straight 5 x 20 seconds each Hamstrings stretch supine with other leg straight 5 x 20 seconds Gluteal stretch (knee to opposite shoulder with other leg straight) 5 x 20 seconds Trunk extension AROM 10 x 3 seconds Yoga Bridge 10 x 5 seconds Hip hike in door fram 10 x 3 seconds bilateral   11/14/2022 Single knee to chest stretch with other leg straight 5 x 20 seconds each Hamstrings stretch supine with other leg straight 5 x 20 seconds Gluteal stretch (knee to opposite shoulder with other leg straight) 5 x 20 seconds Bridging 10 x 5 seconds Trunk extension AROM 10 x 3 seconds Yoga Bridge 10 x 5 seconds   11/11/2022  Single knee to chest stretch with other leg straight 2 x 20 seconds each Hamstrings stretch supine with other leg straight 2 x 20 seconds Gluteal stretch (knee to opposite shoulder with other leg straight) 2 x 20 seconds  PATIENT EDUCATION:  Education details: Reviewed exam findings and home exercise program Person  educated: Patient Education method: Explanation, Demonstration, Tactile cues, Verbal cues, and Handouts Education comprehension: verbalized understanding, returned demonstration, verbal cues required, tactile cues required, and needs further education  HOME EXERCISE PROGRAM:  [id number]  ASSESSMENT:  CLINICAL IMPRESSION: [name] reports continued symptomatic progress and good home exercise program compliance.  We discussed doing a reassessment of his AROM at the next visit.  Although ascending stairs is improving, hip abductors weakness is noted on the right side and this will also be a focus of continued work.  Continue current plan of care to address long-term goals.  OBJECTIVE IMPAIRMENTS: Abnormal gait, decreased activity tolerance, decreased endurance, decreased knowledge of condition, decreased ROM, decreased strength, impaired perceived functional ability, impaired flexibility, postural dysfunction, obesity, and pain.   ACTIVITY LIMITATIONS: bending, sitting, standing, sleeping, stairs, and caring for others  PARTICIPATION LIMITATIONS: interpersonal relationship, driving, community activity, and occupation  PERSONAL FACTORS: Headache, Rt rotator cuff tear, Rt lumbar radiculopathy are also affecting patient's functional outcome.   REHAB POTENTIAL: Good  CLINICAL DECISION MAKING: Stable/uncomplicated  EVALUATION COMPLEXITY: Low   GOALS: Goals reviewed with patient? Yes  SHORT TERM GOALS: Target date: 12/09/2022 Improve bilateral lower extremity for hip flexors to 90 degrees, hamstrings to 45 degrees and hip internal rotation to 5 degrees Baseline: 80 degrees, 35/30 degrees and 0/-5 degrees respectively Goal status: INITIAL  2.  [name] will be independent with his day 1 home exercise program Baseline: Started 11/11/2022 Goal status: Met 11/22/2022   LONG TERM GOALS: Target date: 01/06/2023  Improve FOTO to goal established at initial FOTO Baseline: Deferred to visit 2 secondary to  arriving 11 minutes late to his 45-minute appointment Goal status: INITIAL  2.  [name] will report right hip and groin pain consistently less than 3 out of 10 on the numeric pain rating scale Baseline: Can be as high as 5 out of 10 Goal status: On Going 11/22/2022  3.  Improve bilateral hip abductors strength to 5 out of 5 MMT Baseline: 5-/5 Goal status: INITIAL  4.  [name] will be independent with his long-term maintenance home exercise program Baseline: Started 11/11/2022 Goal status: INITIAL  5.  [name] will report improved sitting and standing endurance as compared to evaluation Baseline: Limited sitting and standing endurance 11/11/2022 Goal status: INITIAL   PLAN:  PT FREQUENCY: 1-2x/week  PT DURATION: 8 weeks  PLANNED INTERVENTIONS: Therapeutic exercises, Therapeutic activity, Neuromuscular re-education, Balance training, Gait training, Patient/Family education, Self Care, Joint mobilization, Stair training, Dry Needling, Cryotherapy, and Manual therapy  PLAN FOR NEXT SESSION: Review home exercise program.  Progress right hip abductor strength as appropriate although emphasis should remain on flexibility.  At an appropriate time, progress into stairs as [name] notes particular difficulty with ascending stairs.   Cherlyn Cushing, PT, MPT 11/22/2022, 4:09 PM

## 2022-11-25 ENCOUNTER — Ambulatory Visit (INDEPENDENT_AMBULATORY_CARE_PROVIDER_SITE_OTHER): Payer: 59 | Admitting: Rehabilitative and Restorative Service Providers"

## 2022-11-25 ENCOUNTER — Encounter: Payer: Self-pay | Admitting: Rehabilitative and Restorative Service Providers"

## 2022-11-25 DIAGNOSIS — M25651 Stiffness of right hip, not elsewhere classified: Secondary | ICD-10-CM

## 2022-11-25 DIAGNOSIS — R262 Difficulty in walking, not elsewhere classified: Secondary | ICD-10-CM | POA: Diagnosis not present

## 2022-11-25 DIAGNOSIS — M6281 Muscle weakness (generalized): Secondary | ICD-10-CM | POA: Diagnosis not present

## 2022-11-25 DIAGNOSIS — M25551 Pain in right hip: Secondary | ICD-10-CM | POA: Diagnosis not present

## 2022-11-25 NOTE — Therapy (Signed)
OUTPATIENT PHYSICAL THERAPY LOWER EXTREMITY TREATMENT   Patient Name: Kristopher Stout MRN: [id number] DOB:04/15/1956, 67 y.o., male Today's Date: 11/25/2022  END OF SESSION:  PT End of Session - 11/25/22 1601     Visit Number 4    Number of Visits 12    Date for PT Re-Evaluation 01/06/23    Authorization - Number of Visits 60    Progress Note Due on Visit 12    PT Start Time 1601    PT Stop Time 1640    PT Time Calculation (min) 39 min    Activity Tolerance Patient tolerated treatment well;No increased pain    Behavior During Therapy WFL for tasks assessed/performed                Past Medical History:  Diagnosis Date   Headache    Hypogonadism male    Insomnia    Sleep apnea    Past Surgical History:  Procedure Laterality Date   CHOLECYSTECTOMY  07/21/2017   ELBOW SURGERY Left 1999   NASAL SINUS SURGERY  1993   SHOULDER ARTHROSCOPY WITH ROTATOR CUFF REPAIR AND SUBACROMIAL DECOMPRESSION Right 04/16/2018   Procedure: RIGHT SHOULDER ARTHROSCOPY SUBACROMIAL DECOMPRESSION, MINI OPEN ROTATOR CUFF TEAR REPAIR;  Surgeon: Cammy Copa, MD;  Location: [facility] OR;  Service: Orthopedics;  Laterality: Right;   Patient Active Problem List   Diagnosis Date Noted   Lumbar radiculopathy, right 04/14/2020   Traumatic complete tear of right rotator cuff    Other synovitis and tenosynovitis, right shoulder    Hypogonadism male    Headache    Insomnia    Screening for prostate cancer 09/27/2016   Hypogonadism, male 05/16/2015    PCP: Vivien Presto, MD  REFERRING PROVIDER: Tarry Kos, MD  REFERRING DIAG:  Diagnosis  M25.551 (ICD-10-CM) - Pain in right hip    THERAPY DIAG:  Difficulty in walking, not elsewhere classified  Muscle weakness (generalized)  Stiffness of right hip, not elsewhere classified  Pain in right hip  Rationale for Evaluation and Treatment: Rehabilitation  ONSET DATE: Chronic, about a year in duration  SUBJECTIVE:   SUBJECTIVE  STATEMENT: [name] notes continued symptomatic relief with his early physical therapy.  HEP compliance continues to be good.  [name] notes no "broomstick to the rear" pain the past 2 days although he had some pain with aggressive bridging.  In January 2022 he had a toe fusion.  He feels this may have contributed to his right hip pain which started bothering him last year.  He notes some right lateral thigh numbness.  He got relief with oral steroids.    He has not had an injection yet.  PERTINENT HISTORY: Headache, Rt rotator cuff tear, Rt lumbar radiculopathy  PAIN:  Are you having pain? Yes: NPRS scale: 1-7/10 over the past 3 days on a 10/10 Pain location: Right hip and gluteal Pain description: Sharp in the groin and "broomstick to the rear" pain with gluteal Aggravating factors: Sit to stand, prolonged driving, prolonged standing Relieving factors: Better with walking, steroids  PRECAUTIONS: None  WEIGHT BEARING RESTRICTIONS: No  FALLS:  Has patient fallen in last 6 months? 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  No acute abnormalities.  PATIENT SURVEYS:   FOTO Next visit (11 minutes late to evaluation)  COGNITION: Overall cognitive status: Within functional limits for tasks assessed     SENSATION: Right lateral thigh numbness  MUSCLE LENGTH: Hamstrings: Right 30 deg; Left 35 deg  POSTURE: rounded shoulders, forward head, and decreased lumbar lordosis  LOWER EXTREMITY ROM:  Passive ROM Left/Right 11/11/2022 11/14/2022  Hip flexion 80/80    Hip extension    Hip abduction    Hip adduction    Hip internal rotation 0/-5   Hip external rotation 39/40   Knee flexion    Knee extension    Ankle dorsiflexion    Ankle plantarflexion    Ankle inversion    Ankle eversion    Lumbar extension AROM  10   (Blank rows = not tested)  LOWER EXTREMITY STRENGTH:  MMT out of 5 Left/Right 11/11/2022   Hip flexion 5/5-   Hip extension    Hip abduction 4/4   Hip adduction    Hip internal rotation    Hip external rotation    Knee flexion    Knee extension 5/4   Ankle dorsiflexion    Ankle plantarflexion    Ankle inversion    Ankle eversion     (Blank rows = not tested)  GAIT: Distance walked: 100 feet Assistive device utilized: None Level of assistance: Complete Independence Comments: The only significant deviations noted was right hip external rotation.  Bilateral foot overpronation was also noted.   TODAY'S TREATMENT:                                                                                                                              DATE:  11/25/2022 Single knee to chest stretch with other leg straight 5 x 20 seconds each Hamstrings stretch supine with other leg straight 5 x 20 seconds Gluteal stretch (knee to opposite shoulder with other leg straight) 5 x 20 seconds Trunk extension AROM 10 x 3 seconds Yoga Bridge 10 x 5 seconds Hip hike in door frame 10 x 3 seconds bilateral  Functional Activities: Single-leg press 50# and 75# 10 x each, slow eccentrics Step-up and over 8 inch step slow eccentrics 10 x   11/22/2022 Single knee to chest stretch with other leg straight 5 x 20 seconds each Hamstrings stretch supine with other leg straight 5 x 20 seconds Gluteal stretch (knee to opposite shoulder with other leg straight) 5 x 20 seconds Trunk extension AROM 10 x 3 seconds Yoga Bridge 10 x 5 seconds Hip hike in door frame 10 x 3 seconds bilateral   11/14/2022 Single knee to chest stretch with other leg straight 5 x  20 seconds each Hamstrings stretch supine with other leg straight 5 x 20 seconds Gluteal stretch (knee to opposite shoulder with other leg straight) 5  x 20 seconds Bridging 10 x 5 seconds Trunk extension AROM 10 x 3 seconds Yoga Bridge 10 x 5 seconds   PATIENT EDUCATION:  Education details: Reviewed exam findings and home exercise program Person educated: Patient Education method: Explanation, Demonstration, Tactile cues, Verbal cues, and Handouts Education comprehension: verbalized understanding, returned demonstration, verbal cues required, tactile cues required, and needs further education  HOME EXERCISE PROGRAM: [id number]  ASSESSMENT:  CLINICAL IMPRESSION: [name] reports he may be a bit too aggressive on the bridges as he had some increased pain with this last Friday.  Things were no problem Saturday and Sunday when he decreased intensity.  We will do a brief hip AROM check at the next visit.  Although ascending stairs is improving, hip abductors weakness is noted on the right side with strength and functional activities today.  This remains a focus of continued work.  Continue current plan of care to address long-term goals.  OBJECTIVE IMPAIRMENTS: Abnormal gait, decreased activity tolerance, decreased endurance, decreased knowledge of condition, decreased ROM, decreased strength, impaired perceived functional ability, impaired flexibility, postural dysfunction, obesity, and pain.   ACTIVITY LIMITATIONS: bending, sitting, standing, sleeping, stairs, and caring for others  PARTICIPATION LIMITATIONS: interpersonal relationship, driving, community activity, and occupation  PERSONAL FACTORS: Headache, Rt rotator cuff tear, Rt lumbar radiculopathy are also affecting patient's functional outcome.   REHAB POTENTIAL: Good  CLINICAL DECISION MAKING: Stable/uncomplicated  EVALUATION COMPLEXITY: Low   GOALS: Goals reviewed with patient? Yes  SHORT TERM GOALS: Target date:  12/09/2022 Improve bilateral lower extremity for hip flexors to 90 degrees, hamstrings to 45 degrees and hip internal rotation to 5 degrees Baseline: 80 degrees, 35/30 degrees and 0/-5 degrees respectively Goal status: INITIAL  2.  [name] will be independent with his day 1 home exercise program Baseline: Started 11/11/2022 Goal status: Met 11/22/2022   LONG TERM GOALS: Target date: 01/06/2023  Improve FOTO to goal established at initial FOTO Baseline: Deferred to visit 2 secondary to arriving 11 minutes late to his 45-minute appointment Goal status: INITIAL  2.  [name] will report right hip and groin pain consistently less than 3 out of 10 on the numeric pain rating scale Baseline: Can be as high as 5 out of 10 Goal status: On Going 11/25/2022  3.  Improve bilateral hip abductors strength to 5 out of 5 MMT Baseline: 5-/5 Goal status: INITIAL  4.  [name] will be independent with his long-term maintenance home exercise program Baseline: Started 11/11/2022 Goal status: INITIAL  5.  [name] will report improved sitting and standing endurance as compared to evaluation Baseline: Limited sitting and standing endurance 11/11/2022 Goal status: INITIAL   PLAN:  PT FREQUENCY: 1-2x/week  PT DURATION: 8 weeks  PLANNED INTERVENTIONS: Therapeutic exercises, Therapeutic activity, Neuromuscular re-education, Balance training, Gait training, Patient/Family education, Self Care, Joint mobilization, Stair training, Dry Needling, Cryotherapy, and Manual therapy  PLAN FOR NEXT SESSION: Review home exercise program.  Progress right hip abductor strength as appropriate although emphasis should remain on flexibility.  Brief hip AROM reassessment.   Cherlyn Cushing, PT, MPT 11/25/2022, 4:44 PM

## 2022-11-29 ENCOUNTER — Encounter: Payer: 59 | Admitting: Rehabilitative and Restorative Service Providers"

## 2022-12-04 ENCOUNTER — Encounter: Payer: Self-pay | Admitting: Rehabilitative and Restorative Service Providers"

## 2022-12-04 ENCOUNTER — Ambulatory Visit (INDEPENDENT_AMBULATORY_CARE_PROVIDER_SITE_OTHER): Payer: 59 | Admitting: Rehabilitative and Restorative Service Providers"

## 2022-12-04 DIAGNOSIS — M25651 Stiffness of right hip, not elsewhere classified: Secondary | ICD-10-CM | POA: Diagnosis not present

## 2022-12-04 DIAGNOSIS — M25551 Pain in right hip: Secondary | ICD-10-CM | POA: Diagnosis not present

## 2022-12-04 DIAGNOSIS — M6281 Muscle weakness (generalized): Secondary | ICD-10-CM

## 2022-12-04 DIAGNOSIS — R262 Difficulty in walking, not elsewhere classified: Secondary | ICD-10-CM

## 2022-12-04 NOTE — Therapy (Signed)
OUTPATIENT PHYSICAL THERAPY LOWER EXTREMITY TREATMENT   Patient Name: XANE AMSDEN MRN: [id number] DOB:09-15-1955, 67 y.o., male Today's Date: 12/04/2022  END OF SESSION:  PT End of Session - 12/04/22 1516     Visit Number 5    Number of Visits 12    Date for PT Re-Evaluation 01/06/23    Authorization - Number of Visits 60    Progress Note Due on Visit 12    PT Start Time 1515    PT Stop Time 1558    PT Time Calculation (min) 43 min    Activity Tolerance Patient tolerated treatment well;No increased pain    Behavior During Therapy WFL for tasks assessed/performed                 Past Medical History:  Diagnosis Date   Headache    Hypogonadism male    Insomnia    Sleep apnea    Past Surgical History:  Procedure Laterality Date   CHOLECYSTECTOMY  07/21/2017   ELBOW SURGERY Left 1999   NASAL SINUS SURGERY  1993   SHOULDER ARTHROSCOPY WITH ROTATOR CUFF REPAIR AND SUBACROMIAL DECOMPRESSION Right 04/16/2018   Procedure: RIGHT SHOULDER ARTHROSCOPY SUBACROMIAL DECOMPRESSION, MINI OPEN ROTATOR CUFF TEAR REPAIR;  Surgeon: Cammy Copa, MD;  Location: [facility] OR;  Service: Orthopedics;  Laterality: Right;   Patient Active Problem List   Diagnosis Date Noted   Lumbar radiculopathy, right 04/14/2020   Traumatic complete tear of right rotator cuff    Other synovitis and tenosynovitis, right shoulder    Hypogonadism male    Headache    Insomnia    Screening for prostate cancer 09/27/2016   Hypogonadism, male 05/16/2015    PCP: Vivien Presto, MD  REFERRING PROVIDER: Tarry Kos, MD  REFERRING DIAG:  Diagnosis  M25.551 (ICD-10-CM) - Pain in right hip    THERAPY DIAG:  Difficulty in walking, not elsewhere classified  Muscle weakness (generalized)  Stiffness of right hip, not elsewhere classified  Pain in right hip  Rationale for Evaluation and Treatment: Rehabilitation  ONSET DATE: Chronic, about a year in duration  SUBJECTIVE:   SUBJECTIVE  STATEMENT: [name] notes "no more broomstick" pain while groin soreness remains, although it is better after stretching.  HEP compliance continues to be good.    In January 2022 he had a toe fusion.  He feels this may have contributed to his right hip pain which started bothering him last year.  He notes some right lateral thigh numbness.  He got relief with oral steroids.    He has not had an injection yet.  PERTINENT HISTORY: Headache, Rt rotator cuff tear, Rt lumbar radiculopathy  PAIN:  Are you having pain? Yes: NPRS scale: 0-5/10 over the past 3 days on a 10/10 Pain location: Right hip and gluteal Pain description: Sharp in the groin and "broomstick to the rear" pain with gluteal Aggravating factors: Sit to stand, prolonged driving, prolonged standing Relieving factors: Better with walking, steroids  PRECAUTIONS: None  WEIGHT BEARING RESTRICTIONS: No  FALLS:  Has patient fallen in last 6 months? No  LIVING ENVIRONMENT: Lives with: lives with their spouse Lives in: House/apartment Stairs:  Can get pain with repeated stairs, worse up Has following equipment at home: shower chair  OCCUPATION: Art gallery manager (chemical, mechanical)  PLOF: Independent  PATIENT GOALS: Less pain, be able to sit and stand for longer periods of time  NEXT MD VISIT: NA  OBJECTIVE:   DIAGNOSTIC FINDINGS: X-rays show mild right hip osteoarthritis  with a small cam deformity of  the femoral head.  No acute abnormalities.  PATIENT SURVEYS:   FOTO Next visit (11 minutes late to evaluation)  COGNITION: Overall cognitive status: Within functional limits for tasks assessed     SENSATION: Right lateral thigh numbness  MUSCLE LENGTH: 12/04/2022: Hamstrings: Right 40 deg; Left 40 deg  Eval: Hamstrings: Right 30 deg; Left 35 deg  POSTURE: rounded shoulders, forward head, and decreased lumbar lordosis  LOWER EXTREMITY ROM:  Passive ROM Left/Right 11/11/2022 11/14/2022 12/04/2022 Left/Right  Hip flexion 80/80   90/90  Hip extension     Hip abduction     Hip adduction     Hip internal rotation 0/-5  4/2  Hip external rotation 39/40  38/44  Knee flexion     Knee extension     Ankle dorsiflexion     Ankle plantarflexion     Ankle inversion     Ankle eversion     Lumbar extension AROM  10    (Blank rows = not tested)  LOWER EXTREMITY STRENGTH:  MMT out of 5 Left/Right 11/11/2022   Hip flexion 5/5-   Hip extension    Hip abduction 4/4   Hip adduction    Hip internal rotation    Hip external rotation    Knee flexion    Knee extension 5/4   Ankle dorsiflexion    Ankle plantarflexion    Ankle inversion    Ankle eversion     (Blank rows = not tested)  GAIT: Distance walked: 100 feet Assistive device utilized: None Level of assistance: Complete Independence Comments: The only significant deviations noted was right hip external rotation.  Bilateral foot overpronation was also noted.   TODAY'S TREATMENT:                                                                                                                              DATE:  12/04/2022 Single knee to chest stretch with other leg straight 4 x 20 seconds each Hamstrings stretch supine with other leg straight 4 x 20 seconds Gluteal stretch (knee to opposite shoulder with other leg straight) 4 x 20 seconds Trunk extension AROM 10 x 3 seconds Yoga Bridge 10 x 5 seconds Hip hike in door frame 10 x 3 seconds bilateral  Functional Activities: Single-leg press 75# 10 x each, slow eccentrics Step-up and over 8 inch step slow eccentrics 10 x   11/25/2022 Single knee to chest stretch with other leg straight 5 x 20 seconds each Hamstrings stretch supine with other leg straight 5 x 20 seconds Gluteal stretch (knee to opposite shoulder with other leg straight) 5 x 20 seconds Trunk extension AROM 10 x 3 seconds Yoga Bridge 10 x 5 seconds Hip hike in door frame 10 x 3 seconds bilateral  Functional Activities: Single-leg press 50# and  75# 10 x each, slow eccentrics Step-up and over 8 inch step slow eccentrics 10 x   11/22/2022 Single  knee to chest stretch with other leg straight 5 x 20 seconds each Hamstrings stretch supine with other leg straight 5 x 20 seconds Gluteal stretch (knee to opposite shoulder with other leg straight) 5 x 20 seconds Trunk extension AROM 10 x 3 seconds Yoga Bridge 10 x 5 seconds Hip hike in door frame 10 x 3 seconds bilateral   PATIENT EDUCATION:  Education details: Reviewed exam findings and home exercise program Person educated: Patient Education method: Explanation, Demonstration, Tactile cues, Verbal cues, and Handouts Education comprehension: verbalized understanding, returned demonstration, verbal cues required, tactile cues required, and needs further education  HOME EXERCISE PROGRAM: [email or web address]  ASSESSMENT:  CLINICAL IMPRESSION: [name] reports continued progress with his right hip, gluteal and groin pain with his supervised PT.  AROM is objectively  improving while hip abductors weakness remains on the right side.  Continued strength and functional activities should address this and [name] has been doing a great job with his HEP.  We discussed giving [name] 3 weeks to work on things at home before a follow-up visits to assess progress towards remaining long-term goals.  OBJECTIVE IMPAIRMENTS: Abnormal gait, decreased activity tolerance, decreased endurance, decreased knowledge of condition, decreased ROM, decreased strength, impaired perceived functional ability, impaired flexibility, postural dysfunction, obesity, and pain.   ACTIVITY LIMITATIONS: bending, sitting, standing, sleeping, stairs, and caring for others  PARTICIPATION LIMITATIONS: interpersonal relationship, driving, community activity, and occupation  PERSONAL FACTORS: Headache, Rt rotator cuff tear, Rt lumbar radiculopathy are also affecting patient's functional outcome.   REHAB POTENTIAL: Good  CLINICAL DECISION MAKING:  Stable/uncomplicated  EVALUATION COMPLEXITY: Low   GOALS: Goals reviewed with patient? Yes  SHORT TERM GOALS: Target date: 12/09/2022 Improve bilateral lower extremity for hip flexors to 90 degrees, hamstrings to 45 degrees and hip internal rotation to 5 degrees Baseline: 80 degrees, 35/30 degrees and 0/-5 degrees respectively Goal status: Partially Met 12/04/2022  2.  [name] will be independent with his day 1 home exercise program Baseline: Started 11/11/2022 Goal status: Met 11/22/2022   LONG TERM GOALS: Target date: 01/06/2023  Improve FOTO to goal established at initial FOTO Baseline: Deferred to visit 2 secondary to arriving 11 minutes late to his 45-minute appointment Goal status: INITIAL  2.  [name] will report right hip and groin pain consistently less than 3 out of 10 on the numeric pain rating scale Baseline: Can be as high as 5 out of 10 Goal status: On Going 12/04/2022  3.  Improve bilateral hip abductors strength to 5 out of 5 MMT Baseline: 5-/5 Goal status: INITIAL  4.  [name] will be independent with his long-term maintenance home exercise program Baseline: Started 11/11/2022 Goal status: On Going 12/04/2022  5.  [name] will report improved sitting and standing endurance as compared to evaluation Baseline: Limited sitting and standing endurance 11/11/2022 Goal status: Partially Met (better) 12/04/2022   PLAN:  PT FREQUENCY: 1-2x/week  PT DURATION: 8 weeks  PLANNED INTERVENTIONS: Therapeutic exercises, Therapeutic activity, Neuromuscular re-education, Balance training, Gait training, Patient/Family education, Self Care, Joint mobilization, Stair training, Dry Needling, Cryotherapy, and Manual therapy  PLAN FOR NEXT SESSION: Review home exercise program.  Progress right hip abductor strength as appropriate although emphasis should remain on flexibility.  Progress note and DC?   Cherlyn Cushing, PT, MPT 12/04/2022, 5:07 PM

## 2022-12-27 ENCOUNTER — Ambulatory Visit (INDEPENDENT_AMBULATORY_CARE_PROVIDER_SITE_OTHER): Payer: 59 | Admitting: Rehabilitative and Restorative Service Providers"

## 2022-12-27 ENCOUNTER — Encounter: Payer: Self-pay | Admitting: Rehabilitative and Restorative Service Providers"

## 2022-12-27 DIAGNOSIS — M6281 Muscle weakness (generalized): Secondary | ICD-10-CM | POA: Diagnosis not present

## 2022-12-27 DIAGNOSIS — M25551 Pain in right hip: Secondary | ICD-10-CM

## 2022-12-27 DIAGNOSIS — M25651 Stiffness of right hip, not elsewhere classified: Secondary | ICD-10-CM

## 2022-12-27 DIAGNOSIS — R262 Difficulty in walking, not elsewhere classified: Secondary | ICD-10-CM | POA: Diagnosis not present

## 2022-12-27 NOTE — Therapy (Signed)
OUTPATIENT PHYSICAL THERAPY LOWER EXTREMITY TREATMENT/DISCHARGE  PHYSICAL THERAPY DISCHARGE SUMMARY  Visits from Start of Care: 6  Current functional level related to goals / functional outcomes: See note   Remaining deficits: See note   Education / Equipment: HEP   Patient agrees to discharge. Patient goals were met. Patient is being discharged due to being pleased with the current functional level.   Patient Name: Kristopher Stout MRN: [id number] DOB:16-Feb-1956, 67 y.o., male Today's Date: 12/27/2022  END OF SESSION:  PT End of Session - 12/27/22 0934     Visit Number 6    Number of Visits 12    Date for PT Re-Evaluation 01/06/23    Authorization - Number of Visits 60    Progress Note Due on Visit 12    PT Start Time 0932    PT Stop Time 1012    PT Time Calculation (min) 40 min    Activity Tolerance Patient tolerated treatment well;No increased pain    Behavior During Therapy WFL for tasks assessed/performed                  Past Medical History:  Diagnosis Date   Headache    Hypogonadism male    Insomnia    Sleep apnea    Past Surgical History:  Procedure Laterality Date   CHOLECYSTECTOMY  07/21/2017   ELBOW SURGERY Left 1999   NASAL SINUS SURGERY  1993   SHOULDER ARTHROSCOPY WITH ROTATOR CUFF REPAIR AND SUBACROMIAL DECOMPRESSION Right 04/16/2018   Procedure: RIGHT SHOULDER ARTHROSCOPY SUBACROMIAL DECOMPRESSION, MINI OPEN ROTATOR CUFF TEAR REPAIR;  Surgeon: Cammy Copa, MD;  Location: [facility] OR;  Service: Orthopedics;  Laterality: Right;   Patient Active Problem List   Diagnosis Date Noted   Lumbar radiculopathy, right 04/14/2020   Traumatic complete tear of right rotator cuff    Other synovitis and tenosynovitis, right shoulder    Hypogonadism male    Headache    Insomnia    Screening for prostate cancer 09/27/2016   Hypogonadism, male 05/16/2015    PCP: Vivien Presto, MD  REFERRING PROVIDER: Tarry Kos, MD  REFERRING DIAG:   Diagnosis  M25.551 (ICD-10-CM) - Pain in right hip    THERAPY DIAG:  Difficulty in walking, not elsewhere classified  Muscle weakness (generalized)  Stiffness of right hip, not elsewhere classified  Pain in right hip  Rationale for Evaluation and Treatment: Rehabilitation  ONSET DATE: Chronic, about a year in duration  SUBJECTIVE:   SUBJECTIVE STATEMENT: [name] notes "no more broomstick" pain and minimal groin soreness.  Soreness is better after stretching.  HEP compliance continues to be good.  Stairs are easier and he can get his pants on without struggling or sitting down.  In January 2022 he had a toe fusion.  He feels this may have contributed to his right hip pain which started bothering him last year.  He notes some right lateral thigh numbness.  He got relief with oral steroids.    He has not had an injection yet.  PERTINENT HISTORY: Headache, Rt rotator cuff tear, Rt lumbar radiculopathy  PAIN:  Are you having pain? Yes: NPRS scale: 0-2/10 over the past 7 days on a 10/10 Pain location: Right hip and gluteal Pain description: Sharp in the groin and "broomstick to the rear" pain with gluteal Aggravating factors: Sit to stand, prolonged driving, prolonged standing Relieving factors: Better with walking, steroids  PRECAUTIONS: None  WEIGHT BEARING RESTRICTIONS: No  FALLS:  Has patient fallen in  last 6 months? 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  No acute abnormalities.  PATIENT SURVEYS:   FOTO Next visit (11 minutes late  to evaluation)  COGNITION: Overall cognitive status: Within functional limits for tasks assessed     SENSATION: Right lateral thigh numbness  MUSCLE LENGTH: 12/27/2022: Hamstrings: Right 50 deg; Left 50 deg  12/04/2022: Hamstrings: Right 40 deg; Left 40 deg  Eval: Hamstrings: Right 30 deg; Left 35 deg  POSTURE: rounded shoulders, forward head, and decreased lumbar lordosis  LOWER EXTREMITY ROM:  Passive ROM Left/Right 11/11/2022 11/14/2022 12/04/2022 Left/Right 12/27/2022 Left/Right  Hip flexion 80/80  90/90 90/95  Hip extension      Hip abduction      Hip adduction      Hip internal rotation 0/-5  4/2 4/2  Hip external rotation 39/40  38/44 34/36  Knee flexion      Knee extension      Ankle dorsiflexion      Ankle plantarflexion      Ankle inversion      Ankle eversion      Lumbar extension AROM  10     (Blank rows = not tested)  LOWER EXTREMITY STRENGTH:  MMT out of 5 Left/Right 11/11/2022 Left/Right 12/27/2022  Hip flexion 5/5- 5/5  Hip extension    Hip abduction 4/4 5-/5-  Hip adduction    Hip internal rotation    Hip external rotation    Knee flexion    Knee extension 5/4 5/5-  Ankle dorsiflexion    Ankle plantarflexion    Ankle inversion    Ankle eversion     (Blank rows = not tested)  GAIT: Distance walked: 100 feet Assistive device utilized: None Level of assistance: Complete Independence Comments: The only significant deviations noted was right hip external rotation.  Bilateral foot overpronation was also noted.   TODAY'S TREATMENT:                                                                                                                              DATE:  12/27/2022 Single knee to chest stretch with other leg straight 4 x 20 seconds each Hamstrings stretch supine with other leg straight 4 x 20 seconds Gluteal stretch (knee to opposite shoulder with other leg straight) 4 x 20 seconds Trunk extension AROM 10 x 3 seconds Yoga Bridge 10 x 5  seconds Hip hike in door frame 10 x 3 seconds bilateral  Progress note & DC instructions completed   12/04/2022 Single knee to chest  stretch with other leg straight 4 x 20 seconds each Hamstrings stretch supine with other leg straight 4 x 20 seconds Gluteal stretch (knee to opposite shoulder with other leg straight) 4 x 20 seconds Trunk extension AROM 10 x 3 seconds Yoga Bridge 10 x 5 seconds Hip hike in door frame 10 x 3 seconds bilateral  Functional Activities: Single-leg press 75# 10 x each, slow eccentrics Step-up and over 8 inch step slow eccentrics 10 x   11/25/2022 Single knee to chest stretch with other leg straight 5 x 20 seconds each Hamstrings stretch supine with other leg straight 5 x 20 seconds Gluteal stretch (knee to opposite shoulder with other leg straight) 5 x 20 seconds Trunk extension AROM 10 x 3 seconds Yoga Bridge 10 x 5 seconds Hip hike in door frame 10 x 3 seconds bilateral  Functional Activities: Single-leg press 50# and 75# 10 x each, slow eccentrics Step-up and over 8 inch step slow eccentrics 10 x   PATIENT EDUCATION:  Education details: Reviewed exam findings and home exercise program Person educated: Patient Education method: Explanation, Demonstration, Tactile cues, Verbal cues, and Handouts Education comprehension: verbalized understanding, returned demonstration, verbal cues required, tactile cues required, and needs further education  HOME EXERCISE PROGRAM: [id number]  ASSESSMENT:  CLINICAL IMPRESSION: [name] reports continued progress with his right hip, gluteal and groin pain with his independent PT over the past 3 weeks.  Continued consistent with his home strength and flexibility activities should address his remaining impairments and [name] has been doing a great job with his HEP.  He appears ready to transfer into independent rehabilitation.  OBJECTIVE IMPAIRMENTS: Abnormal gait, decreased activity tolerance, decreased endurance, decreased  knowledge of condition, decreased ROM, decreased strength, impaired perceived functional ability, impaired flexibility, postural dysfunction, obesity, and pain.   ACTIVITY LIMITATIONS: bending, sitting, standing, sleeping, stairs, and caring for others  PARTICIPATION LIMITATIONS: interpersonal relationship, driving, community activity, and occupation  PERSONAL FACTORS: Headache, Rt rotator cuff tear, Rt lumbar radiculopathy are also affecting patient's functional outcome.   REHAB POTENTIAL: Good  CLINICAL DECISION MAKING: Stable/uncomplicated  EVALUATION COMPLEXITY: Low   GOALS: Goals reviewed with patient? Yes  SHORT TERM GOALS: Target date: 12/09/2022 Improve bilateral lower extremity for hip flexors to 90 degrees, hamstrings to 45 degrees and hip internal rotation to 5 degrees Baseline: 80 degrees, 35/30 degrees and 0/-5 degrees respectively Goal status: Partially Met 12/27/2022  2.  [name] will be independent with his day 1 home exercise program Baseline: Started 11/11/2022 Goal status: Met 11/22/2022   LONG TERM GOALS: Target date: 01/06/2023  Improve FOTO to goal established at initial FOTO Baseline: Deferred to visit 2 secondary to arriving 11 minutes late to his 45-minute appointment Goal status: INITIAL  2.  [name] will report right hip and groin pain consistently less than 3 out of 10 on the numeric pain rating scale Baseline: Can be as high as 5 out of 10 Goal status: Met 12/27/2022  3.  Improve bilateral hip abductors strength to 5 out of 5 MMT Baseline: 5-/5 Goal status: On Going 12/27/2022  4.  [name] will be independent with his long-term maintenance home exercise program Baseline: Started 11/11/2022 Goal status: Met 12/27/2022  5.  [name] will report improved sitting and standing endurance as compared to evaluation Baseline: Limited sitting and standing endurance 11/11/2022 Goal status: Met 12/27/2022   PLAN:  PT FREQUENCY: DC  PT DURATION: DC  PLANNED INTERVENTIONS:  Therapeutic exercises, Therapeutic activity, Neuromuscular re-education, Balance training, Gait training, Patient/Family education, Self  Care, Joint mobilization, Stair training, Dry Needling, Cryotherapy, and Manual therapy  PLAN FOR NEXT SESSION: DC   Cherlyn Cushing, PT, MPT 12/27/2022, 10:19 AM
